# Patient Record
Sex: Female | Born: 1955 | Race: White | Hispanic: No | Marital: Married | Smoking: Never smoker
Health system: Southern US, Community
[De-identification: ages and names within clinical notes are randomized; demographics above are authoritative.]

## PROBLEM LIST (undated history)

## (undated) DIAGNOSIS — T8859XA Other complications of anesthesia, initial encounter: Secondary | ICD-10-CM

## (undated) DIAGNOSIS — R112 Nausea with vomiting, unspecified: Secondary | ICD-10-CM

## (undated) DIAGNOSIS — Z9889 Other specified postprocedural states: Secondary | ICD-10-CM

## (undated) DIAGNOSIS — I10 Essential (primary) hypertension: Secondary | ICD-10-CM

## (undated) DIAGNOSIS — T4145XA Adverse effect of unspecified anesthetic, initial encounter: Secondary | ICD-10-CM

## (undated) DIAGNOSIS — Z8739 Personal history of other diseases of the musculoskeletal system and connective tissue: Secondary | ICD-10-CM

## (undated) DIAGNOSIS — M1990 Unspecified osteoarthritis, unspecified site: Secondary | ICD-10-CM

## (undated) HISTORY — PX: TONSILLECTOMY: SUR1361

## (undated) HISTORY — PX: BLADDER REPAIR: SHX76

## (undated) HISTORY — PX: CHOLECYSTECTOMY: SHX55

---

## 2011-06-10 ENCOUNTER — Other Ambulatory Visit (HOSPITAL_COMMUNITY): Payer: Self-pay | Admitting: Family Medicine

## 2011-06-10 DIAGNOSIS — Z139 Encounter for screening, unspecified: Secondary | ICD-10-CM

## 2011-06-20 ENCOUNTER — Ambulatory Visit (HOSPITAL_COMMUNITY): Payer: Self-pay

## 2011-06-27 ENCOUNTER — Ambulatory Visit (HOSPITAL_COMMUNITY)
Admission: RE | Admit: 2011-06-27 | Discharge: 2011-06-27 | Disposition: A | Discharge status: Home / Self Care | Payer: PRIVATE HEALTH INSURANCE | Source: Ambulatory Visit | Attending: Family Medicine | Admitting: Family Medicine

## 2011-06-27 DIAGNOSIS — Z1231 Encounter for screening mammogram for malignant neoplasm of breast: Secondary | ICD-10-CM | POA: Insufficient documentation

## 2011-06-27 DIAGNOSIS — Z139 Encounter for screening, unspecified: Secondary | ICD-10-CM

## 2012-05-15 ENCOUNTER — Ambulatory Visit: Payer: Self-pay | Admitting: Internal Medicine

## 2012-05-28 ENCOUNTER — Other Ambulatory Visit (HOSPITAL_COMMUNITY): Payer: Self-pay | Admitting: Internal Medicine

## 2012-05-28 DIAGNOSIS — Z139 Encounter for screening, unspecified: Secondary | ICD-10-CM

## 2012-06-29 ENCOUNTER — Ambulatory Visit (HOSPITAL_COMMUNITY)
Admission: RE | Admit: 2012-06-29 | Discharge: 2012-06-29 | Disposition: A | Discharge status: Home / Self Care | Payer: PRIVATE HEALTH INSURANCE | Source: Ambulatory Visit | Attending: Family Medicine | Admitting: Family Medicine

## 2012-06-29 DIAGNOSIS — Z1231 Encounter for screening mammogram for malignant neoplasm of breast: Secondary | ICD-10-CM | POA: Insufficient documentation

## 2012-06-29 DIAGNOSIS — Z139 Encounter for screening, unspecified: Secondary | ICD-10-CM

## 2012-09-17 ENCOUNTER — Other Ambulatory Visit: Payer: Self-pay | Admitting: Gastroenterology

## 2012-09-17 LAB — CLOSTRIDIUM DIFFICILE BY PCR

## 2012-09-19 LAB — STOOL CULTURE

## 2012-09-25 ENCOUNTER — Ambulatory Visit: Payer: Self-pay | Admitting: Gastroenterology

## 2012-09-27 LAB — PATHOLOGY REPORT

## 2013-05-29 ENCOUNTER — Other Ambulatory Visit (HOSPITAL_COMMUNITY): Payer: Self-pay | Admitting: Internal Medicine

## 2013-05-29 DIAGNOSIS — Z139 Encounter for screening, unspecified: Secondary | ICD-10-CM

## 2013-07-01 ENCOUNTER — Ambulatory Visit (HOSPITAL_COMMUNITY)
Admission: RE | Admit: 2013-07-01 | Discharge: 2013-07-01 | Disposition: A | Discharge status: Home / Self Care | Payer: PRIVATE HEALTH INSURANCE | Source: Ambulatory Visit | Attending: Internal Medicine | Admitting: Internal Medicine

## 2013-07-01 DIAGNOSIS — Z139 Encounter for screening, unspecified: Secondary | ICD-10-CM

## 2013-07-01 DIAGNOSIS — Z1231 Encounter for screening mammogram for malignant neoplasm of breast: Secondary | ICD-10-CM | POA: Insufficient documentation

## 2014-06-02 ENCOUNTER — Other Ambulatory Visit (HOSPITAL_COMMUNITY): Payer: Self-pay | Admitting: Internal Medicine

## 2014-06-02 DIAGNOSIS — Z1231 Encounter for screening mammogram for malignant neoplasm of breast: Secondary | ICD-10-CM

## 2014-07-07 ENCOUNTER — Ambulatory Visit (HOSPITAL_COMMUNITY)
Admission: RE | Admit: 2014-07-07 | Discharge: 2014-07-07 | Disposition: A | Discharge status: Home / Self Care | Payer: PRIVATE HEALTH INSURANCE | Source: Ambulatory Visit | Attending: Internal Medicine | Admitting: Internal Medicine

## 2014-07-07 DIAGNOSIS — Z1231 Encounter for screening mammogram for malignant neoplasm of breast: Secondary | ICD-10-CM | POA: Insufficient documentation

## 2014-10-15 ENCOUNTER — Ambulatory Visit: Payer: Self-pay | Admitting: Internal Medicine

## 2015-08-17 ENCOUNTER — Other Ambulatory Visit: Payer: Self-pay | Admitting: Internal Medicine

## 2015-08-17 DIAGNOSIS — Z1231 Encounter for screening mammogram for malignant neoplasm of breast: Secondary | ICD-10-CM

## 2015-08-20 ENCOUNTER — Ambulatory Visit
Admission: RE | Admit: 2015-08-20 | Discharge: 2015-08-20 | Disposition: A | Discharge status: Home / Self Care | Payer: PRIVATE HEALTH INSURANCE | Source: Ambulatory Visit | Attending: Internal Medicine | Admitting: Internal Medicine

## 2015-08-20 DIAGNOSIS — Z1231 Encounter for screening mammogram for malignant neoplasm of breast: Secondary | ICD-10-CM | POA: Diagnosis present

## 2015-11-17 ENCOUNTER — Other Ambulatory Visit: Payer: Self-pay | Admitting: Internal Medicine

## 2015-11-17 DIAGNOSIS — R1012 Left upper quadrant pain: Secondary | ICD-10-CM

## 2015-11-25 ENCOUNTER — Ambulatory Visit: Admission: RE | Admit: 2015-11-25 | Payer: PRIVATE HEALTH INSURANCE | Source: Ambulatory Visit

## 2015-12-04 ENCOUNTER — Ambulatory Visit: Payer: PRIVATE HEALTH INSURANCE

## 2015-12-08 ENCOUNTER — Ambulatory Visit
Admission: RE | Admit: 2015-12-08 | Discharge: 2015-12-08 | Disposition: A | Discharge status: Home / Self Care | Payer: PRIVATE HEALTH INSURANCE | Source: Ambulatory Visit | Attending: Internal Medicine | Admitting: Internal Medicine

## 2015-12-08 DIAGNOSIS — I700 Atherosclerosis of aorta: Secondary | ICD-10-CM | POA: Diagnosis not present

## 2015-12-08 DIAGNOSIS — R1012 Left upper quadrant pain: Secondary | ICD-10-CM | POA: Insufficient documentation

## 2015-12-08 HISTORY — DX: Essential (primary) hypertension: I10

## 2015-12-08 MED ORDER — IOHEXOL 300 MG/ML  SOLN
100.0000 mL | Freq: Once | INTRAMUSCULAR | Status: AC | PRN
Start: 2015-12-08 — End: 2015-12-08
  Administered 2015-12-08: 100 mL via INTRAVENOUS

## 2016-01-27 ENCOUNTER — Other Ambulatory Visit: Payer: Self-pay | Admitting: Student

## 2016-01-27 DIAGNOSIS — M7581 Other shoulder lesions, right shoulder: Secondary | ICD-10-CM

## 2016-02-01 ENCOUNTER — Ambulatory Visit: Payer: No Typology Code available for payment source

## 2016-02-12 ENCOUNTER — Encounter: Payer: Self-pay | Admitting: *Deleted

## 2016-02-12 ENCOUNTER — Other Ambulatory Visit: Payer: PRIVATE HEALTH INSURANCE

## 2016-02-12 DIAGNOSIS — I10 Essential (primary) hypertension: Secondary | ICD-10-CM

## 2016-02-12 NOTE — Patient Instructions (Signed)
  Your procedure is scheduled on: 2/21/17Report to Day Surgery. To find out your arrival time please call [phone] between 1PM - 3PM on 02/15/16.  Remember: Instructions that are not followed completely may result in serious medical risk, up to and including death, or upon the discretion of your surgeon and anesthesiologist your surgery may need to be rescheduled.    __X__ 1. Do not eat food or drink liquids after midnight. No gum chewing or hard candies.     __X__ 2. No Alcohol for 24 hours before or after surgery.   ____ 3. Bring all medications with you on the day of surgery if instructed.    __X__ 4. Notify your doctor if there is any change in your medical condition     (cold, fever, infections).     Do not wear jewelry, make-up, hairpins, clips or nail polish.  Do not wear lotions, powders, or perfumes. You may wear deodorant.  Do not shave 48 hours prior to surgery. Men may shave face and neck.  Do not bring valuables to the hospital.    [facility] is not responsible for any belongings or valuables.               Contacts, dentures or bridgework may not be worn into surgery.  Leave your suitcase in the car. After surgery it may be brought to your room.  For patients admitted to the hospital, discharge time is determined by your                treatment team.   Patients discharged the day of surgery will not be allowed to drive home.   Please read over the following fact sheets that you were given:   Surgical Site Infection Prevention   ____ Take these medicines the morning of surgery with A SIP OF WATER:    1. NONE  2.   3.   4.  5.  6.  ____ Fleet Enema (as directed)   __X__ Use CHG Soap as directed  ____ Use inhalers on the day of surgery  ____ Stop metformin 2 days prior to surgery    ____ Take 1/2 of usual insulin dose the night before surgery and none on the morning of surgery.   _X___ Stop Coumadin/Plavix/aspirin on  STOP ASPIRIN 02/12/16 ____ Stop  Anti-inflammatories on    ____ Stop supplements until after surgery.    ____ Bring C-Pap to the hospital.

## 2016-02-15 ENCOUNTER — Encounter
Admission: RE | Admit: 2016-02-15 | Discharge: 2016-02-15 | Disposition: A | Discharge status: Home / Self Care | Payer: No Typology Code available for payment source | Source: Ambulatory Visit | Attending: Surgery | Admitting: Surgery

## 2016-02-15 DIAGNOSIS — G8929 Other chronic pain: Secondary | ICD-10-CM | POA: Diagnosis not present

## 2016-02-15 DIAGNOSIS — Z820 Family history of epilepsy and other diseases of the nervous system: Secondary | ICD-10-CM | POA: Diagnosis not present

## 2016-02-15 DIAGNOSIS — Z807 Family history of other malignant neoplasms of lymphoid, hematopoietic and related tissues: Secondary | ICD-10-CM | POA: Diagnosis not present

## 2016-02-15 DIAGNOSIS — Z8379 Family history of other diseases of the digestive system: Secondary | ICD-10-CM | POA: Diagnosis not present

## 2016-02-15 DIAGNOSIS — X58XXXA Exposure to other specified factors, initial encounter: Secondary | ICD-10-CM | POA: Diagnosis not present

## 2016-02-15 DIAGNOSIS — Z8042 Family history of malignant neoplasm of prostate: Secondary | ICD-10-CM | POA: Diagnosis not present

## 2016-02-15 DIAGNOSIS — Z79899 Other long term (current) drug therapy: Secondary | ICD-10-CM | POA: Diagnosis not present

## 2016-02-15 DIAGNOSIS — Z9049 Acquired absence of other specified parts of digestive tract: Secondary | ICD-10-CM | POA: Diagnosis not present

## 2016-02-15 DIAGNOSIS — Z800 Family history of malignant neoplasm of digestive organs: Secondary | ICD-10-CM | POA: Diagnosis not present

## 2016-02-15 DIAGNOSIS — I10 Essential (primary) hypertension: Secondary | ICD-10-CM | POA: Diagnosis not present

## 2016-02-15 DIAGNOSIS — Z7982 Long term (current) use of aspirin: Secondary | ICD-10-CM | POA: Diagnosis not present

## 2016-02-15 DIAGNOSIS — S46121A Laceration of muscle, fascia and tendon of long head of biceps, right arm, initial encounter: Secondary | ICD-10-CM | POA: Diagnosis not present

## 2016-02-15 DIAGNOSIS — K219 Gastro-esophageal reflux disease without esophagitis: Secondary | ICD-10-CM | POA: Diagnosis not present

## 2016-02-15 DIAGNOSIS — M75121 Complete rotator cuff tear or rupture of right shoulder, not specified as traumatic: Secondary | ICD-10-CM | POA: Diagnosis not present

## 2016-02-15 DIAGNOSIS — M549 Dorsalgia, unspecified: Secondary | ICD-10-CM | POA: Diagnosis not present

## 2016-02-15 DIAGNOSIS — M7541 Impingement syndrome of right shoulder: Secondary | ICD-10-CM | POA: Diagnosis present

## 2016-02-15 LAB — POTASSIUM: POTASSIUM: 4.3 mmol/L (ref 3.5–5.1)

## 2016-02-16 ENCOUNTER — Ambulatory Visit
Admission: RE | Admit: 2016-02-16 | Discharge: 2016-02-16 | Disposition: A | Discharge status: Home / Self Care | Payer: No Typology Code available for payment source | Source: Ambulatory Visit | Attending: Surgery | Admitting: Surgery

## 2016-02-16 ENCOUNTER — Encounter: Payer: Self-pay | Admitting: *Deleted

## 2016-02-16 ENCOUNTER — Ambulatory Visit: Payer: No Typology Code available for payment source | Admitting: Anesthesiology

## 2016-02-16 ENCOUNTER — Encounter
Admission: RE | Disposition: A | Discharge status: Home / Self Care | Payer: Self-pay | Source: Ambulatory Visit | Attending: Surgery

## 2016-02-16 DIAGNOSIS — M7541 Impingement syndrome of right shoulder: Secondary | ICD-10-CM | POA: Insufficient documentation

## 2016-02-16 DIAGNOSIS — K219 Gastro-esophageal reflux disease without esophagitis: Secondary | ICD-10-CM | POA: Insufficient documentation

## 2016-02-16 DIAGNOSIS — G8929 Other chronic pain: Secondary | ICD-10-CM | POA: Insufficient documentation

## 2016-02-16 DIAGNOSIS — M75121 Complete rotator cuff tear or rupture of right shoulder, not specified as traumatic: Secondary | ICD-10-CM | POA: Insufficient documentation

## 2016-02-16 DIAGNOSIS — S46121A Laceration of muscle, fascia and tendon of long head of biceps, right arm, initial encounter: Secondary | ICD-10-CM | POA: Insufficient documentation

## 2016-02-16 DIAGNOSIS — X58XXXA Exposure to other specified factors, initial encounter: Secondary | ICD-10-CM | POA: Insufficient documentation

## 2016-02-16 DIAGNOSIS — Z820 Family history of epilepsy and other diseases of the nervous system: Secondary | ICD-10-CM | POA: Insufficient documentation

## 2016-02-16 DIAGNOSIS — I10 Essential (primary) hypertension: Secondary | ICD-10-CM | POA: Insufficient documentation

## 2016-02-16 DIAGNOSIS — Z79899 Other long term (current) drug therapy: Secondary | ICD-10-CM | POA: Insufficient documentation

## 2016-02-16 DIAGNOSIS — Z800 Family history of malignant neoplasm of digestive organs: Secondary | ICD-10-CM | POA: Insufficient documentation

## 2016-02-16 DIAGNOSIS — Z807 Family history of other malignant neoplasms of lymphoid, hematopoietic and related tissues: Secondary | ICD-10-CM | POA: Insufficient documentation

## 2016-02-16 DIAGNOSIS — Z7982 Long term (current) use of aspirin: Secondary | ICD-10-CM | POA: Insufficient documentation

## 2016-02-16 DIAGNOSIS — M549 Dorsalgia, unspecified: Secondary | ICD-10-CM | POA: Insufficient documentation

## 2016-02-16 DIAGNOSIS — Z9049 Acquired absence of other specified parts of digestive tract: Secondary | ICD-10-CM | POA: Insufficient documentation

## 2016-02-16 DIAGNOSIS — Z8042 Family history of malignant neoplasm of prostate: Secondary | ICD-10-CM | POA: Insufficient documentation

## 2016-02-16 DIAGNOSIS — Z8379 Family history of other diseases of the digestive system: Secondary | ICD-10-CM | POA: Insufficient documentation

## 2016-02-16 HISTORY — DX: Adverse effect of unspecified anesthetic, initial encounter: T41.45XA

## 2016-02-16 HISTORY — PX: SHOULDER ARTHROSCOPY: SHX128

## 2016-02-16 HISTORY — DX: Other specified postprocedural states: Z98.890

## 2016-02-16 HISTORY — DX: Nausea with vomiting, unspecified: R11.2

## 2016-02-16 HISTORY — DX: Other complications of anesthesia, initial encounter: T88.59XA

## 2016-02-16 SURGERY — ARTHROSCOPY, SHOULDER
Anesthesia: General | Site: Shoulder | Laterality: Right | Wound class: Clean

## 2016-02-16 MED ORDER — SCOPOLAMINE 1 MG/3DAYS TD PT72
1.0000 | MEDICATED_PATCH | Freq: Once | TRANSDERMAL | Status: DC
  Administered 2016-02-16: 1.5 mg via TRANSDERMAL

## 2016-02-16 MED ORDER — BUPIVACAINE-EPINEPHRINE (PF) 0.5% -1:200000 IJ SOLN
INTRAMUSCULAR | Status: AC
  Filled 2016-02-16: qty 30

## 2016-02-16 MED ORDER — ONDANSETRON HCL 4 MG/2ML IJ SOLN
INTRAMUSCULAR | Status: DC | PRN
  Administered 2016-02-16: 4 mg via INTRAVENOUS

## 2016-02-16 MED ORDER — EPINEPHRINE HCL 1 MG/ML IJ SOLN
INTRAMUSCULAR | Status: AC
  Filled 2016-02-16: qty 4

## 2016-02-16 MED ORDER — OXYCODONE HCL 5 MG PO TABS: 5.0000 mg | ORAL_TABLET | ORAL | Status: DC | PRN

## 2016-02-16 MED ORDER — EPINEPHRINE HCL 1 MG/ML IJ SOLN
INTRAMUSCULAR | Status: DC | PRN
  Administered 2016-02-16: 2 mL

## 2016-02-16 MED ORDER — FAMOTIDINE 20 MG PO TABS
20.0000 mg | ORAL_TABLET | Freq: Once | ORAL | Status: AC
  Administered 2016-02-16: 20 mg via ORAL

## 2016-02-16 MED ORDER — SCOPOLAMINE 1 MG/3DAYS TD PT72
MEDICATED_PATCH | TRANSDERMAL | Status: AC
  Administered 2016-02-16: 1.5 mg via TRANSDERMAL
  Filled 2016-02-16: qty 1

## 2016-02-16 MED ORDER — LIDOCAINE HCL (CARDIAC) 20 MG/ML IV SOLN
INTRAVENOUS | Status: DC | PRN
  Administered 2016-02-16: 30 mg via INTRAVENOUS

## 2016-02-16 MED ORDER — CHLORHEXIDINE GLUCONATE 4 % EX LIQD
60.0000 mL | Freq: Once | CUTANEOUS | Status: AC
  Administered 2016-02-16: 4 via TOPICAL

## 2016-02-16 MED ORDER — GLYCOPYRROLATE 0.2 MG/ML IJ SOLN
INTRAMUSCULAR | Status: DC | PRN
  Administered 2016-02-16: 0.2 mg via INTRAVENOUS

## 2016-02-16 MED ORDER — LIDOCAINE HCL (PF) 1 % IJ SOLN
INTRAMUSCULAR | Status: AC
  Filled 2016-02-16: qty 5

## 2016-02-16 MED ORDER — PROPOFOL 10 MG/ML IV BOLUS
INTRAVENOUS | Status: DC | PRN
  Administered 2016-02-16: 200 mg via INTRAVENOUS

## 2016-02-16 MED ORDER — CEFAZOLIN SODIUM-DEXTROSE 2-3 GM-% IV SOLR
INTRAVENOUS | Status: AC
  Administered 2016-02-16: 2 g via INTRAVENOUS
  Filled 2016-02-16: qty 50

## 2016-02-16 MED ORDER — FAMOTIDINE 20 MG PO TABS
ORAL_TABLET | ORAL | Status: AC
  Administered 2016-02-16: 20 mg via ORAL
  Filled 2016-02-16: qty 1

## 2016-02-16 MED ORDER — FENTANYL CITRATE (PF) 100 MCG/2ML IJ SOLN: 25.0000 ug | INTRAMUSCULAR | Status: DC | PRN

## 2016-02-16 MED ORDER — MIDAZOLAM HCL 2 MG/2ML IJ SOLN
INTRAMUSCULAR | Status: DC | PRN
  Administered 2016-02-16: 2 mg via INTRAVENOUS

## 2016-02-16 MED ORDER — CEFAZOLIN SODIUM-DEXTROSE 2-3 GM-% IV SOLR: 2.0000 g | INTRAVENOUS | Status: DC

## 2016-02-16 MED ORDER — DEXAMETHASONE SODIUM PHOSPHATE 10 MG/ML IJ SOLN
INTRAMUSCULAR | Status: DC | PRN
  Administered 2016-02-16: 5 mg via INTRAVENOUS

## 2016-02-16 MED ORDER — ONDANSETRON HCL 4 MG/2ML IJ SOLN: 4.0000 mg | Freq: Once | INTRAMUSCULAR | Status: DC | PRN

## 2016-02-16 MED ORDER — BUPIVACAINE-EPINEPHRINE (PF) 0.5% -1:200000 IJ SOLN
INTRAMUSCULAR | Status: DC | PRN
  Administered 2016-02-16: 30 mL via PERINEURAL

## 2016-02-16 MED ORDER — FENTANYL CITRATE (PF) 100 MCG/2ML IJ SOLN
INTRAMUSCULAR | Status: DC | PRN
  Administered 2016-02-16: 100 ug via INTRAVENOUS

## 2016-02-16 MED ORDER — LACTATED RINGERS IV SOLN
INTRAVENOUS | Status: DC
  Administered 2016-02-16 (×2): via INTRAVENOUS

## 2016-02-16 MED ORDER — EPINEPHRINE HCL 1 MG/ML IJ SOLN
INTRAMUSCULAR | Status: AC
  Filled 2016-02-16: qty 1

## 2016-02-16 MED ORDER — MIDAZOLAM HCL 5 MG/5ML IJ SOLN
INTRAMUSCULAR | Status: AC
  Administered 2016-02-16: 2 mg
  Filled 2016-02-16: qty 5

## 2016-02-16 MED ORDER — ROPIVACAINE HCL 2 MG/ML IJ SOLN
INTRAMUSCULAR | Status: AC
  Filled 2016-02-16: qty 40

## 2016-02-16 SURGICAL SUPPLY — 53 items
ANCHOR JUGGERKNOT WTAP NDL 2.9 (Anchor) ×3 IMPLANT
BIT DRILL JUGRKNT W/NDL BIT2.9 (DRILL) ×1 IMPLANT
BLADE FULL RADIUS 3.5 (BLADE) IMPLANT
BLADE SHAVER 4.5X7 STR FR (MISCELLANEOUS) IMPLANT
BUR ACROMIONIZER 4.0 (BURR) ×3 IMPLANT
BUR BR 5.5 WIDE MOUTH (BURR) IMPLANT
CANNULA 8.5X75 THRED (CANNULA) IMPLANT
CANNULA SHAVER 8MMX76MM (CANNULA) ×3 IMPLANT
CHLORAPREP W/TINT 26ML (MISCELLANEOUS) ×6 IMPLANT
DECANTER SPIKE VIAL GLASS SM (MISCELLANEOUS) ×3 IMPLANT
DRAPE IMP U-DRAPE 54X76 (DRAPES) ×6 IMPLANT
DRAPE SURG 17X11 SM STRL (DRAPES) ×3 IMPLANT
DRILL JUGGERKNOT W/NDL BIT 2.9 (DRILL) ×3
DRSG OPSITE POSTOP 4X8 (GAUZE/BANDAGES/DRESSINGS) ×3 IMPLANT
ELECT REM PT RETURN 9FT ADLT (ELECTROSURGICAL) ×3
ELECTRODE REM PT RTRN 9FT ADLT (ELECTROSURGICAL) ×1 IMPLANT
GAUZE PETRO XEROFOAM 1X8 (MISCELLANEOUS) ×3 IMPLANT
GAUZE SPONGE 4X4 12PLY STRL (GAUZE/BANDAGES/DRESSINGS) ×3 IMPLANT
GLOVE BIO SURGEON STRL SZ7.5 (GLOVE) ×6 IMPLANT
GLOVE BIO SURGEON STRL SZ8 (GLOVE) ×6 IMPLANT
GLOVE BIOGEL PI IND STRL 8 (GLOVE) IMPLANT
GLOVE BIOGEL PI INDICATOR 8 (GLOVE)
GLOVE INDICATOR 8.0 STRL GRN (GLOVE) ×3 IMPLANT
GOWN STRL REUS W/ TWL LRG LVL3 (GOWN DISPOSABLE) ×2 IMPLANT
GOWN STRL REUS W/ TWL XL LVL3 (GOWN DISPOSABLE) ×1 IMPLANT
GOWN STRL REUS W/TWL LRG LVL3 (GOWN DISPOSABLE) ×4
GOWN STRL REUS W/TWL XL LVL3 (GOWN DISPOSABLE) ×2
GRASPER SUT 15 45D LOW PRO (SUTURE) IMPLANT
IV LACTATED RINGER IRRG 3000ML (IV SOLUTION) ×4
IV LR IRRIG 3000ML ARTHROMATIC (IV SOLUTION) ×2 IMPLANT
MANIFOLD NEPTUNE II (INSTRUMENTS) ×3 IMPLANT
MASK FACE SPIDER DISP (MASK) ×3 IMPLANT
MAT BLUE FLOOR 46X72 FLO (MISCELLANEOUS) ×3 IMPLANT
NDL MAYO CATGUT SZ4 (NEEDLE) IMPLANT
NEEDLE HYPO 22GX1.5 SAFETY (NEEDLE) ×3 IMPLANT
NEEDLE MAYO 6 CRC TAPER PT (NEEDLE) ×3 IMPLANT
NEEDLE MAYO CATGUT SZ 1.5 (NEEDLE)
NEEDLE MAYO CATGUT SZ 2 (NEEDLE) IMPLANT
NEEDLE REVERSE CUT 1/2 CRC (NEEDLE) IMPLANT
PACK ARTHROSCOPY SHOULDER (MISCELLANEOUS) ×3 IMPLANT
SLING ARM LRG DEEP (SOFTGOODS) IMPLANT
SLING ULTRA II LG (MISCELLANEOUS) ×3 IMPLANT
STAPLER SKIN PROX 35W (STAPLE) ×3 IMPLANT
STRAP SAFETY BODY (MISCELLANEOUS) ×3 IMPLANT
SUT ETHIBOND 0 MO6 C/R (SUTURE) ×3 IMPLANT
SUT PROLENE 4 0 PS 2 18 (SUTURE) ×3 IMPLANT
SUT VIC AB 2-0 CT1 27 (SUTURE) ×4
SUT VIC AB 2-0 CT1 TAPERPNT 27 (SUTURE) ×2 IMPLANT
TAPE MICROFOAM 4IN (TAPE) ×3 IMPLANT
TUBING ARTHRO INFLOW-ONLY STRL (TUBING) ×3 IMPLANT
TUBING CONNECTING 10 (TUBING) ×2 IMPLANT
TUBING CONNECTING 10' (TUBING) ×1
WAND HAND CNTRL MULTIVAC 90 (MISCELLANEOUS) ×3 IMPLANT

## 2016-02-16 NOTE — Anesthesia Preprocedure Evaluation (Signed)
Anesthesia Evaluation  Patient identified by MRN, date of birth, ID band Patient awake    Reviewed: Allergy & Precautions, H&P , NPO status , Patient's Chart, lab work & pertinent test results, reviewed documented beta blocker date and time   History of Anesthesia Complications (+) PONV and history of anesthetic complications  Airway Mallampati: IV  TM Distance: >3 FB Neck ROM: full    Dental  (+) Teeth Intact   Pulmonary neg pulmonary ROS,    Pulmonary exam normal        Cardiovascular hypertension, negative cardio ROS Normal cardiovascular exam Rhythm:regular Rate:Normal     Neuro/Psych negative neurological ROS  negative psych ROS   GI/Hepatic negative GI ROS, Neg liver ROS,   Endo/Other  negative endocrine ROS  Renal/GU negative Renal ROS  negative genitourinary   Musculoskeletal   Abdominal   Peds  Hematology negative hematology ROS (+)   Anesthesia Other Findings Past Medical History:   Hypertension                                                 Complication of anesthesia                                   PONV (postoperative nausea and vomiting)                   Past Surgical History:   CHOLECYSTECTOMY                                               BLADDER REPAIR                                              BMI    Body Mass Index   25.83 kg/m 2     Reproductive/Obstetrics negative OB ROS                             Anesthesia Physical Anesthesia Plan  ASA: II  Anesthesia Plan: General ETT   Post-op Pain Management: MAC Combined w/ Regional for Post-op pain   Induction:   Airway Management Planned:   Additional Equipment:   Intra-op Plan:   Post-operative Plan:   Informed Consent: I have reviewed the patients History and Physical, chart, labs and discussed the procedure including the risks, benefits and alternatives for the proposed anesthesia with the patient  or authorized representative who has indicated his/her understanding and acceptance.   Dental Advisory Given  Plan Discussed with: CRNA  Anesthesia Plan Comments:         Anesthesia Quick Evaluation

## 2016-02-16 NOTE — Discharge Instructions (Addendum)
Keep dressing dry and intact.  May shower after dressing changed on post-op day #4 (Saturday).  Cover staples with Band-Aids after drying off. Apply ice frequently to shoulder. Take ibuprofen 600 or 800 mg 3 times daily with meals.  Take oxycodone as prescribed as needed for pain.  May supplement with Tylenol as necessary. Keep shoulder immobilizer on at all times except may remove for bathing purposes. Follow-up in 10-14 days or as scheduled.General Anesthesia, Adult General anesthesia is a sleep-like state of non-feeling produced by medicines (anesthetics). General anesthesia prevents you from being alert and feeling pain during a medical procedure. Your caregiver may recommend general anesthesia if your procedure:  Is long.  Is painful or uncomfortable.  Would be frightening to see or hear.  Requires you to be still.  Affects your breathing.  Causes significant blood loss. LET YOUR CAREGIVER KNOW ABOUT:  Allergies to food or medicine.  Medicines taken, including vitamins, herbs, eyedrops, over-the-counter medicines, and creams.  Use of steroids (by mouth or creams).  Previous problems with anesthetics or numbing medicines, including problems experienced by relatives.  History of bleeding problems or blood clots.  Previous surgeries and types of anesthetics received.  Possibility of pregnancy, if this applies.  Use of cigarettes, alcohol, or illegal drugs.  Any health condition(s), especially diabetes, sleep apnea, and high blood pressure. RISKS AND COMPLICATIONS General anesthesia rarely causes complications. However, if complications do occur, they can be life threatening. Complications include:  A lung infection.  A stroke.  A heart attack.  Waking up during the procedure. When this occurs, the patient may be unable to move and communicate that he or she is awake. The patient may feel severe pain. Older adults and adults with serious medical problems are more  likely to have complications than adults who are young and healthy. Some complications can be prevented by answering all of your caregiver's questions thoroughly and by following all pre-procedure instructions. It is important to tell your caregiver if any of the pre-procedure instructions, especially those related to diet, were not followed. Any food or liquid in the stomach can cause problems when you are under general anesthesia. BEFORE THE PROCEDURE  Ask your caregiver if you will have to spend the night at the hospital. If you will not have to spend the night, arrange to have an adult drive you and stay with you for 24 hours.  Follow your caregiver's instructions if you are taking dietary supplements or medicines. Your caregiver may tell you to stop taking them or to reduce your dosage.  Do not smoke for as long as possible before your procedure. If possible, stop smoking 3-6 weeks before the procedure.  Do not take new dietary supplements or medicines within 1 week of your procedure unless your caregiver approves them.  Do not eat within 8 hours of your procedure or as directed by your caregiver. Drink only clear liquids, such as water, black coffee (without milk or cream), and fruit juices (without pulp).  Do not drink within 3 hours of your procedure or as directed by your caregiver.  You may brush your teeth on the morning of the procedure, but make sure to spit out the toothpaste and water when finished. PROCEDURE  You will receive anesthetics through a mask, through an intravenous (IV) access tube, or through both. A doctor who specializes in anesthesia (anesthesiologist) or a nurse who specializes in anesthesia (nurse anesthetist) or both will stay with you throughout the procedure to make sure you remain  unconscious. He or she will also watch your blood pressure, pulse, and oxygen levels to make sure that the anesthetics do not cause any problems. Once you are asleep, a breathing tube  or mask may be used to help you breathe. AFTER THE PROCEDURE You will wake up after the procedure is complete. You may be in the room where the procedure was performed or in a recovery area. You may have a sore throat if a breathing tube was used. You may also feel:  Dizzy.  Weak.  Drowsy.  Confused.  Nauseous.  Cold. These are all normal responses and can be expected to last for up to 24 hours after the procedure is complete. A caregiver will tell you when you are ready to go home. This will usually be when you are fully awake and in stable condition.   This information is not intended to replace advice given to you by your health care provider. Make sure you discuss any questions you have with your health care provider.   Document Released: 03/20/2008 Document Revised: 01/02/2015 Document Reviewed: 04/11/2012 Elsevier Interactive Patient Education [employer]

## 2016-02-16 NOTE — Anesthesia Postprocedure Evaluation (Signed)
Anesthesia Post Note  Patient: Laura Mckay  Procedure(s) Performed: Procedure(s) (LRB): ARTHROSCOPY SHOULDER, rotator cuff repair, extensive debridement, biceps tenodesis, decompression (Right)  Patient location during evaluation: PACU Anesthesia Type: General Level of consciousness: awake and alert Pain management: pain level controlled Vital Signs Assessment: post-procedure vital signs reviewed and stable Respiratory status: spontaneous breathing, nonlabored ventilation, respiratory function stable and patient connected to nasal cannula oxygen Cardiovascular status: blood pressure returned to baseline and stable Postop Assessment: no signs of nausea or vomiting Anesthetic complications: no    Last Vitals:  Filed Vitals:   02/16/16 1250 02/16/16 1318  BP:  143/68  Pulse:  93  Temp: 36.8 C 36.3 C  Resp:  16    Last Pain:  Filed Vitals:   02/16/16 1320  PainSc: 0-No pain                 Yevette Edwards

## 2016-02-16 NOTE — Transfer of Care (Signed)
Immediate Anesthesia Transfer of Care Note  Patient: Laura Mckay  Procedure(s) Performed: Procedure(s): ARTHROSCOPY SHOULDER, rotator cuff repair, extensive debridement, biceps tenodesis, decompression (Right)  Patient Location: PACU  Anesthesia Type:General and Regional  Level of Consciousness: awake, alert  and oriented  Airway & Oxygen Therapy: Patient Spontanous Breathing and Patient connected to nasal cannula oxygen  Post-op Assessment: Report given to RN and Post -op Vital signs reviewed and stable  Post vital signs: Reviewed and stable  Last Vitals:  Filed Vitals:   02/16/16 1020 02/16/16 1035  BP: 126/64 125/67  Pulse: 74 77  Temp:    Resp: 16 18    Complications: No apparent anesthesia complications

## 2016-02-16 NOTE — H&P (Signed)
Paper H&P to be scanned into permanent record. H&P reviewed. No changes. 

## 2016-02-16 NOTE — Op Note (Signed)
02/16/2016  12:14 PM  Patient:   Laura Mckay  Pre-Op Diagnosis:   Impingement/tendinopathy with full-thickness tear of rotator cuff, tear of long head of biceps tendon, and labral tear, right shoulder.  Postoperative diagnosis: Impingement/tendinopathy with full-thickness rotator cuff tear, degenerative labral fraying, and biceps tendinopathy with complete rupture of long head of biceps tendon, right shoulder.  Procedure: Extensive arthroscopic debridement, arthroscopic subacromial decompression, mini-open rotator cuff repair, and mini-open biceps tenodesis, right shoulder.  Anesthesia: General LMA with interscalene block placed preoperatively by the anesthesiologist.  Surgeon:   Maryagnes Amos, MD  Assistant:   Horris Latino, PA-C  Findings: As above. There was a full-thickness tear involving the mid insertional fibers of the supraspinatus tendon measuring approximately 1.5 cm in anterior-posterior dimension and 2.5 cm in medial-lateral dimension the long head of the biceps tendon had ruptured completely, leaving an approximately 1.5 cm stump attached to the glenoid/labrum. There was extensive degenerative fraying of the labrum without actual detachment from the glenoid. The glenoid and humeral articular surfaces both were in satisfactory condition. There also was a longitudinal tear of the subscapularis tendon which was debrided.  Complications: None  Fluids:   800 cc  Estimated blood loss: 5 cc  Tourniquet time: None  Drains: None  Closure: Staples   Brief clinical note: The patient is a 60 year old female with a 1+ year history of right shoulder pain. The patient's symptoms have progressed despite medications, activity modification, etc. The patient's history and examination are consistent with impingement/tendinopathy with a rotator cuff tear. These findings were confirmed by MRI scan. The patient presents at this time for definitive management of these  shoulder symptoms.  Procedure: The patient underwent placement of an interscalene block by the anesthesiologist in the preoperative holding area before she was brought into the operating room and lain in the supine position. The patient then underwent general laryngeal mask anesthesia before being repositioned in the beach chair position using the beach chair positioner. The right shoulder and upper extremity were prepped with ChloraPrep solution before being draped sterilely. Preoperative antibiotics were administered. A timeout was performed to confirm the proper surgical site before the expected portal sites and incision site were injected with 0.5% Sensorcaine with epinephrine. A posterior portal was created and the glenohumeral joint thoroughly inspected with the findings as described above. An anterior portal was created using an outside-in technique. The labrum and rotator cuff were further probed, again confirming the above-noted findings. The areas of labral fraying anteriorly, superiorly, and posteriorly were debrided back to stable margins using the full-radius resector. In addition, the areas of rotator cuff tearing involving both the supraspinatus and the subscapularis tendons were debrided back to stable margins using the full-radius resector. Finally, biceps stump was debrided back to the labrum using the full-radius resector before the ArthroCare wand was inserted and used to contour the remaining stump to the labrum. The ArthroCare wand was then used to obtain hemostasis as well as to "anneal" the labrum superiorly and anteriorly. The instruments were removed from the joint after suctioning the excess fluid.  The camera was repositioned through the posterior portal into the subacromial space. A separate lateral portal was created using an outside-in technique. The 3.5 mm full-radius resector was introduced and used to perform a subtotal bursectomy. The ArthroCare wand was then inserted and  used to remove the periosteal tissue off the undersurface of the anterior third of the acromion as well as to recess the coracoacromial ligament from its attachment along the anterior  and lateral margins of the acromion. The 4.0 mm acromionizing bur was introduced and used to complete the decompression by removing the undersurface of the anterior third of the acromion. The full radius resector was reintroduced to remove any residual bony debris before the ArthroCare wand was reintroduced to obtain hemostasis. The instruments were then removed from the subacromial space after suctioning the excess fluid.  An approximately 4-5 cm incision was made over the anterolateral aspect of the shoulder beginning at the anterolateral corner of the acromion and extending distally in line with the bicipital groove. This incision was carried down through the subcutaneous tissues to expose the deltoid fascia. The raphae between the anterior and middle thirds was identified and this plane developed to provide access into the subacromial space. Additional bursal tissues were debrided sharply using Metzenbaum scissors. The rotator cuff tear was readily identified. The margins were debrided sharply with a #15 blade and the exposed greater tuberosity roughened with a rongeur. The tear was repaired using a single Biomet 2.9 mm JuggerKnot anchor. One set of sutures was placed through the posterior flap of the second set of sutures were placed in the more anterior flap. Several #0 Ethibond interrupted sutures were placed in a side-to-side fashion to complete the repair of the rotator cuff tear. An apparent watertight closure was obtained.  The bicipital groove was identified by palpation and opened for 1-1.5 cm. The biceps tendon stump was identified through this defect. However, the tendon could not be retrieved through this defect as it appeared to have scarred into the adjacent soft tissues. Therefore, a single #0 Ethibond  interrupted suture was placed to perform a soft tissue tenodesis.  The wound was copiously irrigated with sterile saline solution before the deltoid raphae was reapproximated using 2-0 Vicryl interrupted sutures. The subcutaneous tissues were closed in two layers using 2-0 Vicryl interrupted sutures before the skin was closed using staples. The portal sites also were closed using staples. A sterile bulky dressing was applied to the shoulder before the arm was placed into a shoulder immobilizer. The patient was then awakened, extubated, and returned to the recovery room in satisfactory condition after tolerating the procedure well.

## 2016-02-17 ENCOUNTER — Ambulatory Visit: Payer: PRIVATE HEALTH INSURANCE

## 2016-11-11 ENCOUNTER — Other Ambulatory Visit: Payer: Self-pay | Admitting: Internal Medicine

## 2016-11-11 DIAGNOSIS — Z1231 Encounter for screening mammogram for malignant neoplasm of breast: Secondary | ICD-10-CM

## 2016-11-14 ENCOUNTER — Ambulatory Visit
Admission: RE | Admit: 2016-11-14 | Discharge: 2016-11-14 | Disposition: A | Discharge status: Home / Self Care | Payer: No Typology Code available for payment source | Source: Ambulatory Visit | Attending: Internal Medicine | Admitting: Internal Medicine

## 2016-11-14 ENCOUNTER — Encounter (INDEPENDENT_AMBULATORY_CARE_PROVIDER_SITE_OTHER): Payer: Self-pay

## 2016-11-14 DIAGNOSIS — Z1231 Encounter for screening mammogram for malignant neoplasm of breast: Secondary | ICD-10-CM | POA: Diagnosis present

## 2016-11-29 ENCOUNTER — Other Ambulatory Visit: Payer: Self-pay | Admitting: Internal Medicine

## 2016-11-29 DIAGNOSIS — R1032 Left lower quadrant pain: Principal | ICD-10-CM

## 2016-11-29 DIAGNOSIS — R1031 Right lower quadrant pain: Secondary | ICD-10-CM

## 2017-01-30 ENCOUNTER — Ambulatory Visit: Payer: No Typology Code available for payment source

## 2018-04-25 ENCOUNTER — Other Ambulatory Visit: Payer: Self-pay | Admitting: Internal Medicine

## 2018-04-25 DIAGNOSIS — Z1231 Encounter for screening mammogram for malignant neoplasm of breast: Secondary | ICD-10-CM

## 2018-04-26 ENCOUNTER — Ambulatory Visit
Admission: RE | Admit: 2018-04-26 | Discharge: 2018-04-26 | Disposition: A | Discharge status: Home / Self Care | Payer: No Typology Code available for payment source | Source: Ambulatory Visit | Attending: Internal Medicine | Admitting: Internal Medicine

## 2018-04-26 DIAGNOSIS — Z1231 Encounter for screening mammogram for malignant neoplasm of breast: Secondary | ICD-10-CM

## 2018-07-18 ENCOUNTER — Other Ambulatory Visit: Payer: Self-pay | Admitting: Internal Medicine

## 2018-07-18 DIAGNOSIS — M5441 Lumbago with sciatica, right side: Principal | ICD-10-CM

## 2018-07-18 DIAGNOSIS — G8929 Other chronic pain: Secondary | ICD-10-CM

## 2018-07-18 DIAGNOSIS — M5442 Lumbago with sciatica, left side: Principal | ICD-10-CM

## 2019-03-19 ENCOUNTER — Encounter: Payer: Self-pay | Admitting: Obstetrics and Gynecology

## 2019-04-24 ENCOUNTER — Encounter: Payer: Self-pay | Admitting: Obstetrics and Gynecology

## 2019-05-27 ENCOUNTER — Encounter: Payer: Self-pay | Admitting: Obstetrics and Gynecology

## 2019-05-29 ENCOUNTER — Other Ambulatory Visit: Payer: Self-pay

## 2019-05-29 ENCOUNTER — Encounter: Payer: Self-pay | Admitting: Obstetrics and Gynecology

## 2019-05-29 ENCOUNTER — Ambulatory Visit: Payer: No Typology Code available for payment source | Admitting: Obstetrics and Gynecology

## 2019-05-29 VITALS — BP 182/98 | HR 70 | Ht 65.0 in | Wt 160.1 lb

## 2019-05-29 DIAGNOSIS — N952 Postmenopausal atrophic vaginitis: Secondary | ICD-10-CM

## 2019-05-29 DIAGNOSIS — R8290 Unspecified abnormal findings in urine: Secondary | ICD-10-CM

## 2019-05-29 DIAGNOSIS — N393 Stress incontinence (female) (male): Secondary | ICD-10-CM

## 2019-05-29 LAB — POCT URINALYSIS DIPSTICK OB
Bilirubin, UA: NEGATIVE
Blood, UA: NEGATIVE
Glucose, UA: NEGATIVE
Ketones, UA: NEGATIVE
Leukocytes, UA: NEGATIVE
Nitrite, UA: POSITIVE
POC,PROTEIN,UA: NEGATIVE
Spec Grav, UA: 1.01 (ref 1.010–1.025)
Urobilinogen, UA: 0.2 E.U./dL
pH, UA: 5 (ref 5.0–8.0)

## 2019-05-29 MED ORDER — NITROFURANTOIN MONOHYD MACRO 100 MG PO CAPS: 100.0000 mg | ORAL_CAPSULE | Freq: Two times a day (BID) | ORAL | 1 refills | Status: DC

## 2019-05-29 MED ORDER — ESTROGENS, CONJUGATED 0.625 MG/GM VA CREA: TOPICAL_CREAM | VAGINAL | 1 refills | Status: DC

## 2019-05-29 NOTE — Progress Notes (Signed)
HPI:      Laura Mckay is a 63 y.o. No obstetric history on file. who LMP was No LMP recorded. Patient is postmenopausal.  Subjective:   She presents today with complaint of worsening urine loss.  She had a previous "sling procedure" 14 years ago which she describes as successful.  Approximately 8 months ago she began to leak more often and over the last month it has become significantly worse.  She has no idea what type of sling procedure she had with her was mesh porcine or something else.  She did not have a hysterectomy with the procedure.  Significant note patient has recently begun to experience significant back and left hip pain.  She is on chronic narcotics has received several steroid injections and has a work-up including an MRI scheduled in the next week.  She finds it very difficult to move and to perform daily functions.    Hx: The following portions of the patient's history were reviewed and updated as appropriate:             She  has a past medical history of Complication of anesthesia, Hypertension, and PONV (postoperative nausea and vomiting). She does not have a problem list on file. She  has a past surgical history that includes Cholecystectomy; Bladder repair; and Shoulder arthroscopy (Right, 02/16/2016). Her family history includes Breast cancer in her paternal aunt; Breast cancer (age of onset: 1) in her maternal aunt. She  reports that she has never smoked. She has never used smokeless tobacco. She reports that she does not drink alcohol or use drugs. She has a current medication list which includes the following prescription(s): bisoprolol-hydrochlorothiazide, desloratadine, gabapentin, hydrocodone-acetaminophen, probiotic product, conjugated estrogens, and nitrofurantoin (macrocrystal-monohydrate). She has No Known Allergies.       Review of Systems:  Review of Systems  Constitutional: Denied constitutional symptoms, night sweats, recent illness, fatigue, fever,  insomnia and weight loss.  Eyes: Denied eye symptoms, eye pain, photophobia, vision change and visual disturbance.  Ears/Nose/Throat/Neck: Denied ear, nose, throat or neck symptoms, hearing loss, nasal discharge, sinus congestion and sore throat.  Cardiovascular: Denied cardiovascular symptoms, arrhythmia, chest pain/pressure, edema, exercise intolerance, orthopnea and palpitations.  Respiratory: Denied pulmonary symptoms, asthma, pleuritic pain, productive sputum, cough, dyspnea and wheezing.  Gastrointestinal: Denied, gastro-esophageal reflux, melena, nausea and vomiting.  Genitourinary: See HPI for additional information.  Musculoskeletal: Denied musculoskeletal symptoms, stiffness, swelling, muscle weakness and myalgia.  Dermatologic: Denied dermatology symptoms, rash and scar.  Neurologic: Denied neurology symptoms, dizziness, headache, neck pain and syncope.  Psychiatric: Denied psychiatric symptoms, anxiety and depression.  Endocrine: Denied endocrine symptoms including hot flashes and night sweats.   Meds:   Current Outpatient Medications on File Prior to Visit  Medication Sig Dispense Refill  . bisoprolol-hydrochlorothiazide (ZIAC) 5-6.25 MG tablet Take 1 tablet by mouth daily.    Marland Kitchen desloratadine (CLARINEX) 5 MG tablet Take 5 mg by mouth daily.    Marland Kitchen gabapentin (NEURONTIN) 300 MG capsule 1 po qAM, 1 qLunch, and 2 aHS    . HYDROcodone-acetaminophen (NORCO/VICODIN) 5-325 MG tablet 1 po bid prn    . Probiotic Product (PROBIOTIC ADVANCED PO) Take 1 tablet by mouth daily.     No current facility-administered medications on file prior to visit.     Objective:     Vitals:   05/29/19 1055  BP: (!) 182/98  Pulse: 70              Physical examination   Pelvic:  Vulva: Normal appearance.  No lesions.  Vagina: No lesions or abnormalities noted.  I believe I can palpate the sling anteriorly and it feels as if it is likely permanent mesh. Vaginal atrophy noted  Support:   Second-degree cystocele second-degree rectocele  Urethra No masses tenderness or scarring.  Meatus Normal size without lesions or prolapse.  Cervix: Normal appearance.  No lesions.  Anus: Normal exam.  No lesions.  Perineum: Normal exam.  No lesions.        Bimanual   Uterus: Normal size.  Non-tender.  Mobile.  AV.  Adnexae: No masses.  Non-tender to palpation.  Cul-de-sac: Negative for abnormality.     Assessment:    No obstetric history on file. There are no active problems to display for this patient.    1. Abnormal urine odor   2. Stress incontinence of urine   3. Vaginal atrophy     It is possible that her urine loss has become significantly worse because of a UTI. (Nitrites are noted in urine)   Plan:            1.  We will begin Macrobid for 7 days to treat UTI  2.  Premarin vaginal cream 3 times per week for atrophic vaginitis and possibly strengthening the base of the bladder  3.  Patient to complete work-up for hip and back issues prior to any further evaluation of urine loss  4.  Consider urologic referral because of previous procedures if necessary.  5.  Patient to obtain old records [place]. Orders Orders Placed This Encounter  Procedures  . POC Urinalysis Dipstick OB     Meds ordered this encounter  Medications  . conjugated estrogens (PREMARIN) vaginal cream    Sig: 1 gram intravaginal TIW    Dispense:  60 g    Refill:  1  . nitrofurantoin, macrocrystal-monohydrate, (MACROBID) 100 MG capsule    Sig: Take 1 capsule (100 mg total) by mouth 2 (two) times daily.    Dispense:  14 capsule    Refill:  1      F/U  No follow-ups on file. I spent 32 minutes involved in the care of this patient of which greater than 50% was spent discussing urine loss, previous surgery, work-up for urine loss, function of sling procedure, back and hip issues, steroid injections, all questions answered.  Elonda Huskyavid J. Cyanne Delmar,  M.D. 05/29/2019 11:34 AM

## 2019-05-29 NOTE — Progress Notes (Signed)
Patient comes in today as a new patient. She has had a sling since 2006. She has started leaking bad and has an odor.

## 2019-06-04 ENCOUNTER — Other Ambulatory Visit: Payer: Self-pay | Admitting: Internal Medicine

## 2019-06-04 DIAGNOSIS — Z1231 Encounter for screening mammogram for malignant neoplasm of breast: Secondary | ICD-10-CM

## 2019-06-07 ENCOUNTER — Ambulatory Visit
Admission: RE | Admit: 2019-06-07 | Discharge: 2019-06-07 | Disposition: A | Discharge status: Home / Self Care | Payer: No Typology Code available for payment source | Source: Ambulatory Visit | Attending: Internal Medicine | Admitting: Internal Medicine

## 2019-06-07 ENCOUNTER — Other Ambulatory Visit: Payer: Self-pay

## 2019-06-07 DIAGNOSIS — Z1231 Encounter for screening mammogram for malignant neoplasm of breast: Secondary | ICD-10-CM | POA: Diagnosis not present

## 2019-06-18 ENCOUNTER — Other Ambulatory Visit: Payer: Self-pay

## 2019-06-18 ENCOUNTER — Encounter
Admission: RE | Admit: 2019-06-18 | Discharge: 2019-06-18 | Disposition: A | Discharge status: Home / Self Care | Payer: No Typology Code available for payment source | Source: Ambulatory Visit | Attending: Orthopedic Surgery | Admitting: Orthopedic Surgery

## 2019-06-18 DIAGNOSIS — R001 Bradycardia, unspecified: Secondary | ICD-10-CM | POA: Insufficient documentation

## 2019-06-18 DIAGNOSIS — I4510 Unspecified right bundle-branch block: Secondary | ICD-10-CM | POA: Insufficient documentation

## 2019-06-18 DIAGNOSIS — Z01818 Encounter for other preprocedural examination: Secondary | ICD-10-CM | POA: Diagnosis present

## 2019-06-18 DIAGNOSIS — I10 Essential (primary) hypertension: Secondary | ICD-10-CM | POA: Insufficient documentation

## 2019-06-18 DIAGNOSIS — Z1159 Encounter for screening for other viral diseases: Secondary | ICD-10-CM | POA: Insufficient documentation

## 2019-06-18 HISTORY — DX: Personal history of other diseases of the musculoskeletal system and connective tissue: Z87.39

## 2019-06-18 HISTORY — DX: Unspecified osteoarthritis, unspecified site: M19.90

## 2019-06-18 LAB — CBC
HCT: 40.3 % (ref 36.0–46.0)
Hemoglobin: 13.3 g/dL (ref 12.0–15.0)
MCH: 31.2 pg (ref 26.0–34.0)
MCHC: 33 g/dL (ref 30.0–36.0)
MCV: 94.6 fL (ref 80.0–100.0)
Platelets: 339 10*3/uL (ref 150–400)
RBC: 4.26 MIL/uL (ref 3.87–5.11)
RDW: 12.8 % (ref 11.5–15.5)
WBC: 6.6 10*3/uL (ref 4.0–10.5)
nRBC: 0 % (ref 0.0–0.2)

## 2019-06-18 LAB — URINALYSIS, ROUTINE W REFLEX MICROSCOPIC
Bacteria, UA: NONE SEEN
Bilirubin Urine: NEGATIVE
Glucose, UA: NEGATIVE mg/dL
Hgb urine dipstick: NEGATIVE
Ketones, ur: NEGATIVE mg/dL
Nitrite: NEGATIVE
Protein, ur: NEGATIVE mg/dL
Specific Gravity, Urine: 1.023 (ref 1.005–1.030)
pH: 5 (ref 5.0–8.0)

## 2019-06-18 LAB — BASIC METABOLIC PANEL
Anion gap: 6 (ref 5–15)
BUN: 26 mg/dL — ABNORMAL HIGH (ref 8–23)
CO2: 28 mmol/L (ref 22–32)
Calcium: 9.4 mg/dL (ref 8.9–10.3)
Chloride: 103 mmol/L (ref 98–111)
Creatinine, Ser: 1.04 mg/dL — ABNORMAL HIGH (ref 0.44–1.00)
GFR calc Af Amer: >60 mL/min (ref >60)
GFR calc non Af Amer: 58 mL/min — ABNORMAL LOW (ref >60)
Glucose, Bld: 90 mg/dL (ref 70–99)
Potassium: 3.7 mmol/L (ref 3.5–5.1)
Sodium: 137 mmol/L (ref 135–145)

## 2019-06-18 LAB — TYPE AND SCREEN
ABO/RH(D): O POS
Antibody Screen: NEGATIVE

## 2019-06-18 LAB — SURGICAL PCR SCREEN
MRSA, PCR: NEGATIVE
Staphylococcus aureus: POSITIVE — AB

## 2019-06-18 LAB — SEDIMENTATION RATE: Sed Rate: 8 mm/hr (ref 0–30)

## 2019-06-18 LAB — PROTIME-INR
INR: 1 (ref 0.8–1.2)
Prothrombin Time: 12.7 seconds (ref 11.4–15.2)

## 2019-06-18 LAB — APTT: aPTT: 26 seconds (ref 24–36)

## 2019-06-18 NOTE — Patient Instructions (Signed)
Your procedure is scheduled on: June 25, 2019 TUESDAY Report to Day Surgery on the 2nd floor of the [facility]. To find out your arrival time, please call [phone] between 1PM - 3PM on: June 24, 2019 Monday   REMEMBER: Instructions that are not followed completely may result in serious medical risk, up to and including death; or upon the discretion of your surgeon and anesthesiologist your surgery may need to be rescheduled.  Do not eat food after midnight the night before surgery.  No gum chewing, lozengers or hard candies.  You may however, drink CLEAR liquids up to 2 hours before you are scheduled to arrive for your surgery. Do not drink anything within 2 hours of the start of your surgery.  Clear liquids include: - water  - apple juice without pulp -CLEAR  gatorade - black coffee or tea (Do NOT add milk or creamers to the coffee or tea) Do NOT drink anything that is not on this list.  Type 1 and Type 2 diabetics should only drink water.  No Alcohol for 24 hours before or after surgery.  No Smoking including e-cigarettes for 24 hours prior to surgery.  No chewable tobacco products for at least 6 hours prior to surgery.  No nicotine patches on the day of surgery.  On the morning of surgery brush your teeth with toothpaste and water, you may rinse your mouth with mouthwash if you wish. Do not swallow any toothpaste or mouthwash.  Notify your doctor if there is any change in your medical condition (cold, fever, infection).  Do not wear jewelry, make-up, hairpins, clips or nail polish.  Do not wear lotions, powders, or perfumes OR DEODORANT   Do not shave 48 hours prior to surgery.   Contacts and dentures may not be worn into surgery.  Do not bring valuables to the hospital, including drivers license, insurance or credit cards.  [facility] is not responsible for any belongings or valuables.   TAKE THESE MEDICATIONS THE MORNING OF SURGERY: PAIN PILL IF  NEEDED    Use CHG Soap  as directed on instruction sheet.  Stop Anti-inflammatories (NSAIDS) such as Advil, Aleve, Ibuprofen, Motrin, Naproxen, Naprosyn and ASPIRIN OR  Aspirin based products such as Excedrin, Goodys Powder, BC Powder. (May take Tylenol or Acetaminophen if needed.)  Stop ANY OVER THE COUNTER supplements until after surgery. (May continue Vitamin D, Vitamin B, and multivitamin.)  Wear comfortable clothing (specific to your surgery type) to the hospital.  Plan for stool softeners for home use.  If you are being admitted to the hospital overnight, leave your suitcase in the car. After surgery it may be brought to your room.  If you are being discharged the day of surgery, you will not be allowed to drive home. You will need a responsible adult to drive you home and stay with you that night.   If you are taking public transportation, you will need to have a responsible adult with you. Please confirm with your physician that it is acceptable to use public transportation.   Please call [phone] if you have any questions about these instructions.

## 2019-06-18 NOTE — Pre-Procedure Instructions (Signed)
Abnormal PCR screen- positive for staph. Result send to Dr Rudene Christians office.

## 2019-06-19 LAB — URINE CULTURE: Culture: <10000 — AB

## 2019-06-20 ENCOUNTER — Telehealth: Payer: Self-pay | Admitting: Obstetrics and Gynecology

## 2019-06-20 NOTE — Telephone Encounter (Signed)
MRR faxed back/ Office not able to send [facility] records due to pt never being seen. Note placed in DJE box. Thanks

## 2019-06-21 ENCOUNTER — Other Ambulatory Visit: Payer: Self-pay

## 2019-06-21 ENCOUNTER — Other Ambulatory Visit
Admission: RE | Admit: 2019-06-21 | Discharge: 2019-06-21 | Disposition: A | Discharge status: Home / Self Care | Payer: No Typology Code available for payment source | Source: Ambulatory Visit | Attending: Orthopedic Surgery | Admitting: Orthopedic Surgery

## 2019-06-21 DIAGNOSIS — Z01818 Encounter for other preprocedural examination: Secondary | ICD-10-CM | POA: Diagnosis not present

## 2019-06-22 LAB — NOVEL CORONAVIRUS, NAA (HOSP ORDER, SEND-OUT TO REF LAB; TAT 18-24 HRS): SARS-CoV-2, NAA: NOT DETECTED

## 2019-06-24 MED ORDER — CEFAZOLIN SODIUM-DEXTROSE 2-4 GM/100ML-% IV SOLN
2.0000 g | Freq: Once | INTRAVENOUS | Status: AC
  Administered 2019-06-25: 2 g via INTRAVENOUS

## 2019-06-25 ENCOUNTER — Inpatient Hospital Stay: Payer: No Typology Code available for payment source

## 2019-06-25 ENCOUNTER — Inpatient Hospital Stay: Payer: No Typology Code available for payment source | Admitting: Certified Registered Nurse Anesthetist

## 2019-06-25 ENCOUNTER — Encounter: Payer: Self-pay | Admitting: *Deleted

## 2019-06-25 ENCOUNTER — Other Ambulatory Visit: Payer: Self-pay

## 2019-06-25 ENCOUNTER — Inpatient Hospital Stay
Admission: RE | Admit: 2019-06-25 | Discharge: 2019-06-28 | DRG: 470 | Disposition: A | Discharge status: Home / Self Care | Payer: No Typology Code available for payment source | Attending: Orthopedic Surgery | Admitting: Orthopedic Surgery

## 2019-06-25 ENCOUNTER — Encounter
Admission: RE | Disposition: A | Discharge status: Home / Self Care | Payer: Self-pay | Source: Home / Self Care | Attending: Orthopedic Surgery

## 2019-06-25 DIAGNOSIS — G8929 Other chronic pain: Secondary | ICD-10-CM | POA: Diagnosis present

## 2019-06-25 DIAGNOSIS — Z419 Encounter for procedure for purposes other than remedying health state, unspecified: Secondary | ICD-10-CM

## 2019-06-25 DIAGNOSIS — M549 Dorsalgia, unspecified: Secondary | ICD-10-CM | POA: Diagnosis present

## 2019-06-25 DIAGNOSIS — K219 Gastro-esophageal reflux disease without esophagitis: Secondary | ICD-10-CM | POA: Diagnosis present

## 2019-06-25 DIAGNOSIS — Z791 Long term (current) use of non-steroidal anti-inflammatories (NSAID): Secondary | ICD-10-CM | POA: Diagnosis not present

## 2019-06-25 DIAGNOSIS — G8918 Other acute postprocedural pain: Secondary | ICD-10-CM

## 2019-06-25 DIAGNOSIS — Z96642 Presence of left artificial hip joint: Secondary | ICD-10-CM

## 2019-06-25 DIAGNOSIS — M1612 Unilateral primary osteoarthritis, left hip: Principal | ICD-10-CM | POA: Diagnosis present

## 2019-06-25 DIAGNOSIS — Z79899 Other long term (current) drug therapy: Secondary | ICD-10-CM | POA: Diagnosis not present

## 2019-06-25 DIAGNOSIS — I10 Essential (primary) hypertension: Secondary | ICD-10-CM | POA: Diagnosis present

## 2019-06-25 DIAGNOSIS — Z9049 Acquired absence of other specified parts of digestive tract: Secondary | ICD-10-CM

## 2019-06-25 HISTORY — PX: TOTAL HIP ARTHROPLASTY: SHX124

## 2019-06-25 LAB — CBC
HCT: 37.5 % (ref 36.0–46.0)
Hemoglobin: 12.3 g/dL (ref 12.0–15.0)
MCH: 31.6 pg (ref 26.0–34.0)
MCHC: 32.8 g/dL (ref 30.0–36.0)
MCV: 96.4 fL (ref 80.0–100.0)
Platelets: 273 K/uL (ref 150–400)
RBC: 3.89 MIL/uL (ref 3.87–5.11)
RDW: 12.8 % (ref 11.5–15.5)
WBC: 13.9 K/uL — ABNORMAL HIGH (ref 4.0–10.5)
nRBC: 0 % (ref 0.0–0.2)

## 2019-06-25 LAB — CREATININE, SERUM
Creatinine, Ser: 0.76 mg/dL (ref 0.44–1.00)
GFR calc Af Amer: >60 mL/min
GFR calc non Af Amer: >60 mL/min

## 2019-06-25 LAB — ABO/RH: ABO/RH(D): O POS

## 2019-06-25 SURGERY — ARTHROPLASTY, HIP, TOTAL,POSTERIOR APPROACH
Anesthesia: Spinal | Site: Hip | Laterality: Left

## 2019-06-25 MED ORDER — ONDANSETRON HCL 4 MG/2ML IJ SOLN: 4.0000 mg | Freq: Once | INTRAMUSCULAR | Status: DC | PRN

## 2019-06-25 MED ORDER — PROPOFOL 500 MG/50ML IV EMUL
INTRAVENOUS | Status: AC
  Filled 2019-06-25: qty 50

## 2019-06-25 MED ORDER — ACETAMINOPHEN 10 MG/ML IV SOLN
INTRAVENOUS | Status: DC | PRN
  Administered 2019-06-25: 1000 mg via INTRAVENOUS

## 2019-06-25 MED ORDER — ACETAMINOPHEN 325 MG PO TABS
325.0000 mg | ORAL_TABLET | Freq: Four times a day (QID) | ORAL | Status: DC | PRN
  Administered 2019-06-27 (×2): 650 mg via ORAL
  Filled 2019-06-25 (×2): qty 2

## 2019-06-25 MED ORDER — FAMOTIDINE 20 MG PO TABS
ORAL_TABLET | ORAL | Status: AC
  Administered 2019-06-25: 11:00:00 20 mg via ORAL
  Filled 2019-06-25: qty 1

## 2019-06-25 MED ORDER — BISACODYL 5 MG PO TBEC: 5.0000 mg | DELAYED_RELEASE_TABLET | Freq: Every day | ORAL | Status: DC | PRN

## 2019-06-25 MED ORDER — OXYCODONE HCL 5 MG PO TABS
10.0000 mg | ORAL_TABLET | ORAL | Status: DC | PRN
  Administered 2019-06-25 – 2019-06-26 (×3): 10 mg via ORAL
  Filled 2019-06-25: qty 2

## 2019-06-25 MED ORDER — SODIUM CHLORIDE (PF) 0.9 % IJ SOLN
INTRAMUSCULAR | Status: AC
  Filled 2019-06-25: qty 50

## 2019-06-25 MED ORDER — METOCLOPRAMIDE HCL 10 MG PO TABS: 5.0000 mg | ORAL_TABLET | Freq: Three times a day (TID) | ORAL | Status: DC | PRN

## 2019-06-25 MED ORDER — NEOMYCIN-POLYMYXIN B GU 40-200000 IR SOLN
Status: AC
  Filled 2019-06-25: qty 4

## 2019-06-25 MED ORDER — MEPERIDINE HCL 50 MG/ML IJ SOLN
INTRAMUSCULAR | Status: AC
  Filled 2019-06-25: qty 1

## 2019-06-25 MED ORDER — SODIUM CHLORIDE 0.9 % IV SOLN
INTRAVENOUS | Status: DC | PRN
  Administered 2019-06-25: 50 ug/min via INTRAVENOUS

## 2019-06-25 MED ORDER — ONDANSETRON HCL 4 MG/2ML IJ SOLN
INTRAMUSCULAR | Status: AC
  Filled 2019-06-25: qty 2

## 2019-06-25 MED ORDER — NEOMYCIN-POLYMYXIN B GU 40-200000 IR SOLN
Status: DC | PRN
  Administered 2019-06-25: 4 mL

## 2019-06-25 MED ORDER — GABAPENTIN 300 MG PO CAPS
300.0000 mg | ORAL_CAPSULE | Freq: Every day | ORAL | Status: DC
  Administered 2019-06-25 – 2019-06-27 (×3): 300 mg via ORAL
  Filled 2019-06-25 (×3): qty 1

## 2019-06-25 MED ORDER — MENTHOL 3 MG MT LOZG
1.0000 | LOZENGE | OROMUCOSAL | Status: DC | PRN
  Filled 2019-06-25: qty 9

## 2019-06-25 MED ORDER — METHOCARBAMOL 1000 MG/10ML IJ SOLN
500.0000 mg | Freq: Four times a day (QID) | INTRAVENOUS | Status: DC | PRN
  Filled 2019-06-25: qty 5

## 2019-06-25 MED ORDER — ACETAMINOPHEN 10 MG/ML IV SOLN
INTRAVENOUS | Status: AC
  Filled 2019-06-25: qty 100

## 2019-06-25 MED ORDER — MIDAZOLAM HCL 5 MG/5ML IJ SOLN
INTRAMUSCULAR | Status: DC | PRN
  Administered 2019-06-25: 2 mg via INTRAVENOUS

## 2019-06-25 MED ORDER — DOCUSATE SODIUM 100 MG PO CAPS
100.0000 mg | ORAL_CAPSULE | Freq: Two times a day (BID) | ORAL | Status: DC
  Administered 2019-06-25 – 2019-06-28 (×6): 100 mg via ORAL
  Filled 2019-06-25 (×6): qty 1

## 2019-06-25 MED ORDER — ONDANSETRON HCL 4 MG/2ML IJ SOLN
INTRAMUSCULAR | Status: DC | PRN
  Administered 2019-06-25: 4 mg via INTRAVENOUS

## 2019-06-25 MED ORDER — TRANEXAMIC ACID 1000 MG/10ML IV SOLN
INTRAVENOUS | Status: AC
  Filled 2019-06-25: qty 10

## 2019-06-25 MED ORDER — ZOLPIDEM TARTRATE 5 MG PO TABS: 5.0000 mg | ORAL_TABLET | Freq: Every evening | ORAL | Status: DC | PRN

## 2019-06-25 MED ORDER — MAGNESIUM HYDROXIDE 400 MG/5ML PO SUSP
30.0000 mL | Freq: Every day | ORAL | Status: DC | PRN
  Administered 2019-06-28: 06:00:00 30 mL via ORAL
  Filled 2019-06-25: qty 30

## 2019-06-25 MED ORDER — ONDANSETRON HCL 4 MG PO TABS
4.0000 mg | ORAL_TABLET | Freq: Four times a day (QID) | ORAL | Status: DC | PRN
  Administered 2019-06-26 – 2019-06-28 (×2): 4 mg via ORAL
  Filled 2019-06-25 (×2): qty 1

## 2019-06-25 MED ORDER — FAMOTIDINE 20 MG PO TABS
20.0000 mg | ORAL_TABLET | Freq: Once | ORAL | Status: AC
  Administered 2019-06-25: 11:00:00 20 mg via ORAL

## 2019-06-25 MED ORDER — PHENYLEPHRINE HCL (PRESSORS) 10 MG/ML IV SOLN
INTRAVENOUS | Status: AC
  Filled 2019-06-25: qty 1

## 2019-06-25 MED ORDER — PHENYLEPHRINE HCL (PRESSORS) 10 MG/ML IV SOLN
INTRAVENOUS | Status: DC | PRN
  Administered 2019-06-25 (×3): 100 ug via INTRAVENOUS

## 2019-06-25 MED ORDER — BUPIVACAINE LIPOSOME 1.3 % IJ SUSP
INTRAMUSCULAR | Status: AC
  Filled 2019-06-25: qty 20

## 2019-06-25 MED ORDER — ALUM & MAG HYDROXIDE-SIMETH 200-200-20 MG/5ML PO SUSP: 30.0000 mL | ORAL | Status: DC | PRN

## 2019-06-25 MED ORDER — ENOXAPARIN SODIUM 40 MG/0.4ML ~~LOC~~ SOLN
40.0000 mg | SUBCUTANEOUS | Status: DC
  Administered 2019-06-26 – 2019-06-28 (×3): 40 mg via SUBCUTANEOUS
  Filled 2019-06-25 (×3): qty 0.4

## 2019-06-25 MED ORDER — SODIUM CHLORIDE 0.9 % IV SOLN
INTRAVENOUS | Status: DC | PRN
  Administered 2019-06-25: 80 mL

## 2019-06-25 MED ORDER — SODIUM CHLORIDE FLUSH 0.9 % IV SOLN
INTRAVENOUS | Status: AC
  Filled 2019-06-25: qty 10

## 2019-06-25 MED ORDER — SODIUM CHLORIDE 0.9 % IV SOLN
INTRAVENOUS | Status: DC
  Administered 2019-06-25: 15:00:00 via INTRAVENOUS

## 2019-06-25 MED ORDER — ACETAMINOPHEN 500 MG PO TABS
1000.0000 mg | ORAL_TABLET | Freq: Four times a day (QID) | ORAL | Status: AC
  Administered 2019-06-25 – 2019-06-26 (×4): 1000 mg via ORAL
  Filled 2019-06-25 (×4): qty 2

## 2019-06-25 MED ORDER — BISOPROLOL-HYDROCHLOROTHIAZIDE 5-6.25 MG PO TABS
1.0000 | ORAL_TABLET | Freq: Every day | ORAL | Status: DC
  Administered 2019-06-25 – 2019-06-27 (×3): 1 via ORAL
  Filled 2019-06-25 (×4): qty 1

## 2019-06-25 MED ORDER — BUPIVACAINE-EPINEPHRINE 0.25% -1:200000 IJ SOLN
INTRAMUSCULAR | Status: DC | PRN
  Administered 2019-06-25: 30 mL

## 2019-06-25 MED ORDER — MAGNESIUM CITRATE PO SOLN
1.0000 | Freq: Once | ORAL | Status: AC | PRN
  Administered 2019-06-28: 13:00:00 1 via ORAL
  Filled 2019-06-25 (×2): qty 296

## 2019-06-25 MED ORDER — CEFAZOLIN SODIUM-DEXTROSE 1-4 GM/50ML-% IV SOLN
1.0000 g | Freq: Four times a day (QID) | INTRAVENOUS | Status: AC
  Administered 2019-06-25 (×2): 1 g via INTRAVENOUS
  Filled 2019-06-25 (×2): qty 50

## 2019-06-25 MED ORDER — LORATADINE 10 MG PO TABS
10.0000 mg | ORAL_TABLET | Freq: Every day | ORAL | Status: DC
  Administered 2019-06-26 – 2019-06-28 (×3): 10 mg via ORAL
  Filled 2019-06-25 (×3): qty 1

## 2019-06-25 MED ORDER — MEPERIDINE HCL 50 MG/ML IJ SOLN
6.2500 mg | INTRAMUSCULAR | Status: DC | PRN
  Administered 2019-06-25: 14:00:00 6.25 mg via INTRAVENOUS

## 2019-06-25 MED ORDER — HYDROMORPHONE HCL 1 MG/ML IJ SOLN
0.5000 mg | INTRAMUSCULAR | Status: DC | PRN
  Administered 2019-06-26 – 2019-06-27 (×3): 1 mg via INTRAVENOUS
  Filled 2019-06-25 (×3): qty 1

## 2019-06-25 MED ORDER — OXYCODONE HCL 5 MG PO TABS
5.0000 mg | ORAL_TABLET | ORAL | Status: DC | PRN
  Filled 2019-06-25 (×2): qty 2

## 2019-06-25 MED ORDER — PHENOL 1.4 % MT LIQD
1.0000 | OROMUCOSAL | Status: DC | PRN
  Filled 2019-06-25: qty 177

## 2019-06-25 MED ORDER — LACTATED RINGERS IV SOLN
INTRAVENOUS | Status: DC
  Administered 2019-06-25: 11:00:00 via INTRAVENOUS

## 2019-06-25 MED ORDER — PROPOFOL 500 MG/50ML IV EMUL
INTRAVENOUS | Status: DC | PRN
  Administered 2019-06-25: 50 ug/kg/min via INTRAVENOUS

## 2019-06-25 MED ORDER — BUPIVACAINE-EPINEPHRINE (PF) 0.25% -1:200000 IJ SOLN
INTRAMUSCULAR | Status: AC
  Filled 2019-06-25: qty 30

## 2019-06-25 MED ORDER — RISAQUAD PO CAPS
1.0000 | ORAL_CAPSULE | Freq: Every day | ORAL | Status: DC
  Administered 2019-06-26 – 2019-06-28 (×3): 1 via ORAL
  Filled 2019-06-25 (×4): qty 1

## 2019-06-25 MED ORDER — BUPIVACAINE HCL (PF) 0.5 % IJ SOLN
INTRAMUSCULAR | Status: DC | PRN
  Administered 2019-06-25: 2.5 mL

## 2019-06-25 MED ORDER — TRANEXAMIC ACID-NACL 1000-0.7 MG/100ML-% IV SOLN
INTRAVENOUS | Status: DC | PRN
  Administered 2019-06-25: 1000 mg via INTRAVENOUS

## 2019-06-25 MED ORDER — MIDAZOLAM HCL 2 MG/2ML IJ SOLN
INTRAMUSCULAR | Status: AC
  Filled 2019-06-25: qty 2

## 2019-06-25 MED ORDER — DIPHENHYDRAMINE HCL 12.5 MG/5ML PO ELIX: 12.5000 mg | ORAL_SOLUTION | ORAL | Status: DC | PRN

## 2019-06-25 MED ORDER — METOCLOPRAMIDE HCL 5 MG/ML IJ SOLN: 5.0000 mg | Freq: Three times a day (TID) | INTRAMUSCULAR | Status: DC | PRN

## 2019-06-25 MED ORDER — METHOCARBAMOL 500 MG PO TABS
500.0000 mg | ORAL_TABLET | Freq: Four times a day (QID) | ORAL | Status: DC | PRN
  Administered 2019-06-25 – 2019-06-27 (×3): 500 mg via ORAL
  Filled 2019-06-25 (×3): qty 1

## 2019-06-25 MED ORDER — CEFAZOLIN SODIUM-DEXTROSE 1-4 GM/50ML-% IV SOLN
1.0000 g | Freq: Four times a day (QID) | INTRAVENOUS | Status: DC
  Filled 2019-06-25 (×2): qty 50

## 2019-06-25 MED ORDER — FENTANYL CITRATE (PF) 100 MCG/2ML IJ SOLN: 25.0000 ug | INTRAMUSCULAR | Status: DC | PRN

## 2019-06-25 MED ORDER — TRAMADOL HCL 50 MG PO TABS
50.0000 mg | ORAL_TABLET | Freq: Four times a day (QID) | ORAL | Status: DC
  Administered 2019-06-25 – 2019-06-28 (×11): 50 mg via ORAL
  Filled 2019-06-25 (×12): qty 1

## 2019-06-25 MED ORDER — ONDANSETRON HCL 4 MG/2ML IJ SOLN
4.0000 mg | Freq: Four times a day (QID) | INTRAMUSCULAR | Status: DC | PRN
  Administered 2019-06-26 – 2019-06-27 (×2): 4 mg via INTRAVENOUS
  Filled 2019-06-25 (×2): qty 2

## 2019-06-25 MED ORDER — CEFAZOLIN SODIUM-DEXTROSE 2-4 GM/100ML-% IV SOLN
INTRAVENOUS | Status: AC
  Filled 2019-06-25: qty 100

## 2019-06-25 SURGICAL SUPPLY — 60 items
BLADE SAGITTAL AGGR TOOTH XLG (BLADE) ×3 IMPLANT
BNDG COHESIVE 6X5 TAN STRL LF (GAUZE/BANDAGES/DRESSINGS) ×9 IMPLANT
CANISTER SUCT 1200ML W/VALVE (MISCELLANEOUS) ×3 IMPLANT
CANISTER WOUND CARE 500ML ATS (WOUND CARE) ×3 IMPLANT
CHLORAPREP W/TINT 26 (MISCELLANEOUS) ×3 IMPLANT
COVER WAND RF STERILE (DRAPES) ×3 IMPLANT
DRAPE C-ARM XRAY 36X54 (DRAPES) ×3 IMPLANT
DRAPE INCISE IOBAN 66X60 STRL (DRAPES) IMPLANT
DRAPE POUCH INSTRU U-SHP 10X18 (DRAPES) ×3 IMPLANT
DRAPE SHEET LG 3/4 BI-LAMINATE (DRAPES) ×9 IMPLANT
DRAPE TABLE BACK 80X90 (DRAPES) ×3 IMPLANT
DRESSING SURGICEL FIBRLLR 1X2 (HEMOSTASIS) ×2 IMPLANT
DRSG OPSITE POSTOP 4X8 (GAUZE/BANDAGES/DRESSINGS) ×6 IMPLANT
DRSG SURGICEL FIBRILLAR 1X2 (HEMOSTASIS) ×6
ELECT BLADE 6.5 EXT (BLADE) ×3 IMPLANT
ELECT REM PT RETURN 9FT ADLT (ELECTROSURGICAL) ×3
ELECTRODE REM PT RTRN 9FT ADLT (ELECTROSURGICAL) ×1 IMPLANT
GLOVE BIOGEL PI IND STRL 9 (GLOVE) ×1 IMPLANT
GLOVE BIOGEL PI INDICATOR 9 (GLOVE) ×2
GLOVE SURG SYN 9.0  PF PI (GLOVE) ×4
GLOVE SURG SYN 9.0 PF PI (GLOVE) ×2 IMPLANT
GOWN SRG 2XL LVL 4 RGLN SLV (GOWNS) ×1 IMPLANT
GOWN STRL NON-REIN 2XL LVL4 (GOWNS) ×2
GOWN STRL REUS W/ TWL LRG LVL3 (GOWN DISPOSABLE) ×1 IMPLANT
GOWN STRL REUS W/TWL LRG LVL3 (GOWN DISPOSABLE) ×2
HEMOVAC 400CC 10FR (MISCELLANEOUS) IMPLANT
HIP FEM HD S 28 (Head) ×2 IMPLANT
HOLDER FOLEY CATH W/STRAP (MISCELLANEOUS) ×3 IMPLANT
HOOD PEEL AWAY FLYTE STAYCOOL (MISCELLANEOUS) ×3 IMPLANT
KIT PREVENA INCISION MGT 13 (CANNISTER) ×3 IMPLANT
LINER DBL MOB SZ 0 52MM (Liner) ×2 IMPLANT
MAT ABSORB  FLUID 56X50 GRAY (MISCELLANEOUS) ×2
MAT ABSORB FLUID 56X50 GRAY (MISCELLANEOUS) ×1 IMPLANT
NDL SAFETY ECLIPSE 18X1.5 (NEEDLE) ×1 IMPLANT
NDL SPNL 20GX3.5 QUINCKE YW (NEEDLE) ×2 IMPLANT
NEEDLE HYPO 18GX1.5 SHARP (NEEDLE) ×2
NEEDLE SPNL 20GX3.5 QUINCKE YW (NEEDLE) ×6 IMPLANT
NS IRRIG 1000ML POUR BTL (IV SOLUTION) ×3 IMPLANT
PACK HIP COMPR (MISCELLANEOUS) ×3 IMPLANT
SCALPEL PROTECTED #10 DISP (BLADE) ×6 IMPLANT
SHELL ACETABULAR SZ 52 DM (Shell) ×2 IMPLANT
SOL PREP PVP 2OZ (MISCELLANEOUS) ×3
SOLUTION PREP PVP 2OZ (MISCELLANEOUS) ×1 IMPLANT
SPONGE DRAIN TRACH 4X4 STRL 2S (GAUZE/BANDAGES/DRESSINGS) ×3 IMPLANT
STAPLER SKIN PROX 35W (STAPLE) ×3 IMPLANT
STEM FEMORAL SZ2 STD COLLARED (Stem) ×2 IMPLANT
STRAP SAFETY 5IN WIDE (MISCELLANEOUS) ×3 IMPLANT
SUT DVC 2 QUILL PDO  T11 36X36 (SUTURE) ×2
SUT DVC 2 QUILL PDO T11 36X36 (SUTURE) ×1 IMPLANT
SUT SILK 0 (SUTURE) ×2
SUT SILK 0 30XBRD TIE 6 (SUTURE) ×1 IMPLANT
SUT V-LOC 90 ABS DVC 3-0 CL (SUTURE) ×3 IMPLANT
SUT VIC AB 1 CT1 36 (SUTURE) ×3 IMPLANT
SYR 20CC LL (SYRINGE) ×3 IMPLANT
SYR 30ML LL (SYRINGE) ×3 IMPLANT
SYR 50ML LL SCALE MARK (SYRINGE) ×6 IMPLANT
SYR BULB IRRIG 60ML STRL (SYRINGE) ×3 IMPLANT
TAPE MICROFOAM 4IN (TAPE) ×3 IMPLANT
TOWEL OR 17X26 4PK STRL BLUE (TOWEL DISPOSABLE) ×3 IMPLANT
TRAY FOLEY MTR SLVR 16FR STAT (SET/KITS/TRAYS/PACK) ×3 IMPLANT

## 2019-06-25 NOTE — Anesthesia Procedure Notes (Signed)
Date/Time: 06/25/2019 12:15 PM Performed by: Caryl Asp, CRNA Oxygen Delivery Method: Simple face mask Dental Injury: Teeth and Oropharynx as per pre-operative assessment

## 2019-06-25 NOTE — Anesthesia Post-op Follow-up Note (Signed)
Anesthesia QCDR form completed.        

## 2019-06-25 NOTE — H&P (Signed)
Reviewed paper H+P, will be scanned into chart. No changes noted.  

## 2019-06-25 NOTE — Anesthesia Preprocedure Evaluation (Signed)
Anesthesia Evaluation  Patient identified by MRN, date of birth, ID band Patient awake    Reviewed: Allergy & Precautions, NPO status , Patient's Chart, lab work & pertinent test results  History of Anesthesia Complications (+) PONV and history of anesthetic complications  Airway Mallampati: II  TM Distance: >3 FB Neck ROM: Full    Dental no notable dental hx.    Pulmonary neg pulmonary ROS, neg sleep apnea, neg COPD,    breath sounds clear to auscultation- rhonchi (-) wheezing      Cardiovascular hypertension, Pt. on medications (-) CAD, (-) Past MI, (-) Cardiac Stents and (-) CABG  Rhythm:Regular Rate:Normal - Systolic murmurs and - Diastolic murmurs    Neuro/Psych neg Seizures negative neurological ROS  negative psych ROS   GI/Hepatic negative GI ROS, Neg liver ROS,   Endo/Other  negative endocrine ROSneg diabetes  Renal/GU negative Renal ROS     Musculoskeletal  (+) Arthritis ,   Abdominal (+) - obese,   Peds  Hematology negative hematology ROS (+)   Anesthesia Other Findings Past Medical History: No date: Arthritis     Comment:  hips No date: Complication of anesthesia No date: Hx of degenerative disc disease No date: Hypertension No date: PONV (postoperative nausea and vomiting)   Reproductive/Obstetrics                             Lab Results  Component Value Date   WBC 6.6 06/18/2019   HGB 13.3 06/18/2019   HCT 40.3 06/18/2019   MCV 94.6 06/18/2019   PLT 339 06/18/2019    Anesthesia Physical Anesthesia Plan  ASA: II  Anesthesia Plan: Spinal   Post-op Pain Management:    Induction:   PONV Risk Score and Plan: 3 and Propofol infusion  Airway Management Planned: Natural Airway  Additional Equipment:   Intra-op Plan:   Post-operative Plan:   Informed Consent: I have reviewed the patients History and Physical, chart, labs and discussed the procedure  including the risks, benefits and alternatives for the proposed anesthesia with the patient or authorized representative who has indicated his/her understanding and acceptance.     Dental advisory given  Plan Discussed with: CRNA and Anesthesiologist  Anesthesia Plan Comments:         Anesthesia Quick Evaluation

## 2019-06-25 NOTE — Transfer of Care (Signed)
Immediate Anesthesia Transfer of Care Note  Patient: Laura Mckay  Procedure(s) Performed: TOTAL HIP ARTHROPLASTY LEFT (Left Hip)  Patient Location: PACU  Anesthesia Type:Spinal  Level of Consciousness: awake, alert  and oriented  Airway & Oxygen Therapy: Patient Spontanous Breathing  Post-op Assessment: Report given to RN and Post -op Vital signs reviewed and stable  Post vital signs: Reviewed and stable  Last Vitals:  Vitals Value Taken Time  BP 134/88 06/25/19 1343  Temp    Pulse 82 06/25/19 1343  Resp 18 06/25/19 1343  SpO2 100 % 06/25/19 1343  Vitals shown include unvalidated device data.  Last Pain:  Vitals:   06/25/19 1036  TempSrc: Tympanic  PainSc: 5          Complications: No apparent anesthesia complications

## 2019-06-25 NOTE — Anesthesia Procedure Notes (Signed)
Spinal  Patient location during procedure: OR End time: 06/25/2019 12:09 PM Preanesthetic Checklist Completed: patient identified, site marked, surgical consent, pre-op evaluation, timeout performed, IV checked, risks and benefits discussed and monitors and equipment checked Spinal Block Patient position: sitting Prep: Betadine Patient monitoring: heart rate, continuous pulse ox, blood pressure and cardiac monitor Approach: midline Location: L4-5 Injection technique: single-shot Needle Needle type: Whitacre and Introducer  Needle gauge: 24 G Needle length: 9 cm Additional Notes Negative paresthesia. Negative blood return. Positive free-flowing CSF. Expiration date of kit checked and confirmed. Patient tolerated procedure well, without complications.

## 2019-06-25 NOTE — Op Note (Signed)
06/25/2019  1:42 PM  PATIENT:  Laura Mckay  63 y.o. female  PRE-OPERATIVE DIAGNOSIS:  PRIMARY OSTEOARTHRITIS OF LEFT HIP  POST-OPERATIVE DIAGNOSIS:  PRIMARY OSTEOARTHRITIS OF LEFT HIP  PROCEDURE:  Procedure(s): TOTAL HIP ARTHROPLASTY LEFT (Left)  SURGEON: Laurene Footman, MD  ASSISTANTS: None  ANESTHESIA:   spinal  EBL:  Total I/O In: 600 [I.V.:600] Out: 200 [Urine:100; Blood:100]  BLOOD ADMINISTERED:none  DRAINS: none   LOCAL MEDICATIONS USED:  MARCAINE    and OTHER Exparel  SPECIMEN:  Source of Specimen:  Left femoral head  DISPOSITION OF SPECIMEN:  PATHOLOGY  COUNTS:  YES  TOURNIQUET:  * No tourniquets in log *  IMPLANTS: Medacta AMIS 2 standard, 52 mm Mpact DM cup and liner with metal S 28 mm head  DICTATION: .Dragon Dictation   The patient was brought to the operating room and after spinal anesthesia was obtained patient was placed on the operative table with the ipsilateral foot into the Medacta attachment, contralateral leg on a well-padded table. C-arm was brought in and preop template x-ray taken. After prepping and draping in usual sterile fashion appropriate patient identification and timeout procedures were completed. Anterior approach to the hip was obtained and centered over the greater trochanter and TFL muscle. The subcutaneous tissue was incised hemostasis being achieved by electrocautery. TFL fascia was incised and the muscle retracted laterally deep retractor placed. The lateral femoral circumflex vessels were identified and ligated. The anterior capsule was exposed and a capsulotomy performed. The neck was identified and a femoral neck cut carried out with a saw. The head was removed without difficulty and showed sclerotic femoral head and acetabulum. Reaming was carried out to 50 mm and a 52 mm cup trial gave appropriate tightness to the acetabular component a 52 DM cup was impacted into position. The leg was then externally rotated and  ischiofemoral and pubofemoral releases carried out. The femur was sequentially broached to a size 2, size 2 standard with S head trials were placed and the final components chosen. The 2 standard stem was inserted along with a metal S 28 mm head and 52 mm liner. The hip was reduced and was stable the wound was thoroughly irrigated with fibrillar placed along the posterior capsule and medial neck. The deep fascia ws closed using a heavy Quill after infiltration of 30 cc of quarter percent Sensorcaine with epinephrine.3-0 V-loc to close the skin with skin staples.  Incisional wound VAC applied and patient was sent to recovery in stable condition.   PLAN OF CARE: Admit to inpatient

## 2019-06-26 LAB — CBC
HCT: 30.5 % — ABNORMAL LOW (ref 36.0–46.0)
Hemoglobin: 10.2 g/dL — ABNORMAL LOW (ref 12.0–15.0)
MCH: 31.5 pg (ref 26.0–34.0)
MCHC: 33.4 g/dL (ref 30.0–36.0)
MCV: 94.1 fL (ref 80.0–100.0)
Platelets: 249 10*3/uL (ref 150–400)
RBC: 3.24 MIL/uL — ABNORMAL LOW (ref 3.87–5.11)
RDW: 12.9 % (ref 11.5–15.5)
WBC: 6.4 10*3/uL (ref 4.0–10.5)
nRBC: 0 % (ref 0.0–0.2)

## 2019-06-26 LAB — BASIC METABOLIC PANEL
Anion gap: 6 (ref 5–15)
BUN: 13 mg/dL (ref 8–23)
CO2: 23 mmol/L (ref 22–32)
Calcium: 8.1 mg/dL — ABNORMAL LOW (ref 8.9–10.3)
Chloride: 109 mmol/L (ref 98–111)
Creatinine, Ser: 0.69 mg/dL (ref 0.44–1.00)
GFR calc Af Amer: >60 mL/min (ref >60)
GFR calc non Af Amer: >60 mL/min (ref >60)
Glucose, Bld: 141 mg/dL — ABNORMAL HIGH (ref 70–99)
Potassium: 3.5 mmol/L (ref 3.5–5.1)
Sodium: 138 mmol/L (ref 135–145)

## 2019-06-26 MED ORDER — HYDROCODONE-ACETAMINOPHEN 10-325 MG PO TABS
1.0000 | ORAL_TABLET | ORAL | Status: DC | PRN
  Administered 2019-06-26: 1 via ORAL
  Administered 2019-06-26 – 2019-06-27 (×3): 2 via ORAL
  Administered 2019-06-27: 1 via ORAL
  Filled 2019-06-26: qty 1
  Filled 2019-06-26 (×4): qty 2

## 2019-06-26 MED ORDER — OXYCODONE HCL 5 MG PO TABS: 5.0000 mg | ORAL_TABLET | ORAL | 0 refills | Status: DC | PRN

## 2019-06-26 MED ORDER — HYDROCODONE-ACETAMINOPHEN 10-325 MG PO TABS: 1.0000 | ORAL_TABLET | ORAL | 0 refills | Status: DC | PRN

## 2019-06-26 MED ORDER — ENOXAPARIN SODIUM 40 MG/0.4ML ~~LOC~~ SOLN: 40.0000 mg | SUBCUTANEOUS | 0 refills | Status: DC

## 2019-06-26 MED ORDER — TRAMADOL HCL 50 MG PO TABS: 50.0000 mg | ORAL_TABLET | Freq: Four times a day (QID) | ORAL | 0 refills | Status: DC | PRN

## 2019-06-26 NOTE — TOC Benefit Eligibility Note (Signed)
Transition of Care [facility]) Benefit Eligibility Note    Patient Details  Name: Laura Mckay MRN: [id number] Date of Birth: [birth year]   Medication/Dose: Lovenox 40mg  once daily for 14 days.  Covered?: No  Prescription Coverage Preferred Pharmacy: [facility]  Spoke with Person/Company/Phone Number:: Rodman Pickle with [employer] at [phone]  Prior Approval: Yes(PA required for name brand: [phone])  Deductible: (No deductible on plan per rep.)  Additional Notes: Generic Enoxaparin covered with no PA required.  Estimated copay: $10Dannette Barbara Phone Number: [phone] or [phone] 06/26/2019, 9:49 AM

## 2019-06-26 NOTE — Plan of Care (Signed)

## 2019-06-26 NOTE — Progress Notes (Signed)
   Subjective: 1 Day Post-Op Procedure(s) (LRB): TOTAL HIP ARTHROPLASTY LEFT (Left) Patient reports pain as mild.   Patient is well, and has had no acute complaints or problems We will start therapy today.  Plan is to go Home after hospital stay. no nausea and no vomiting Patient denies any chest pains or shortness of breath. The most part resting well.  Did have episode of pain last night that was controlled with oral medication. Does complain of little tingling sensation under the toes of the left foot  Objective: Vital signs in last 24 hours: Temp:  [97.2 F (36.2 C)-98.9 F (37.2 C)] 98.9 F (37.2 C) (07/01 0430) Pulse Rate:  [55-93] 85 (07/01 0430) Resp:  [13-21] 16 (07/01 0430) BP: (93-157)/(49-87) 99/60 (07/01 0430) SpO2:  [95 %-100 %] 95 % (07/01 0430) FiO2 (%):  [21 %] 21 % (06/30 1514) Weight:  [70.6 kg] 70.6 kg (06/30 1517) Wound VAC in place Heels are non tender and elevated off the bed using rolled towels Some diffuse erythematous tissue to the medial upper thigh  Intake/Output from previous day: 06/30 0701 - 07/01 0700 In: 2356.4 [I.V.:2256.4; IV Piggyback:100] Out: 3400 [Urine:3300; Blood:100] Intake/Output this shift: Total I/O In: 890.4 [I.V.:890.4] Out: 2800 [Urine:2800]  Recent Labs    06/25/19 1626 06/26/19 0458  HGB 12.3 10.2*   Recent Labs    06/25/19 1626 06/26/19 0458  WBC 13.9* 6.4  RBC 3.89 3.24*  HCT 37.5 30.5*  PLT 273 249   Recent Labs    06/25/19 1626 06/26/19 0458  NA  --  138  K  --  3.5  CL  --  109  CO2  --  23  BUN  --  13  CREATININE 0.76 0.69  GLUCOSE  --  141*  CALCIUM  --  8.1*   No results for input(s): LABPT, INR in the last 72 hours.  EXAM General - Patient is Alert, Appropriate and Oriented Extremity - Neurologically intact Neurovascular intact Sensation intact distally Intact pulses distally Dorsiflexion/Plantar flexion intact Compartment soft Dressing - Wound VAC in place Motor Function - intact,  moving foot and toes well on exam.  Able to do straight leg raise on  Past Medical History:  Diagnosis Date  . Arthritis    hips  . Complication of anesthesia   . Hx of degenerative disc disease   . Hypertension   . PONV (postoperative nausea and vomiting)     Assessment/Plan: 1 Day Post-Op Procedure(s) (LRB): TOTAL HIP ARTHROPLASTY LEFT (Left) Active Problems:   Status post total hip replacement, left  Estimated body mass index is 25.9 kg/m as calculated from the following:   Height as of this encounter: '5\' 5"'$  (1.651 m).   Weight as of this encounter: 70.6 kg. Advance diet Up with therapy D/C IV fluids Plan for discharge tomorrow Discharge home with home health  Labs: Met B within normal limits.  Hemoglobin 10.2 DVT Prophylaxis - Lovenox, Foot Pumps and TED hose Weight-Bearing as tolerated to left leg D/C O2 and Pulse OX and try on Room Air Begin working on bowel movement   R. [place] 06/26/2019, 6:48 AM

## 2019-06-26 NOTE — Progress Notes (Signed)
Physical Therapy Treatment Patient Details Name: Laura Mckay MRN: [id number] DOB: 19-Oct-1956 Today's Date: 06/26/2019    History of Present Illness Patient is a 63 year old female s/p Left THA. Patient is WBAT. PMH to include OA, HTN, DDD.    PT Comments    Patient received in recliner, reports she needs to go to the bathroom. Continues to have significant pain with any movement. Requires increased time for all mobility. Patient performed sit to stand from recliner with mod assist, ambulated a few steps to [facility] then ambulated another 12 feet ( 15 total) with RW and min guard assist. Requires mod assist and increased time with sit to supine. Unable to raise legs onto bed without assistance.  Patient is very pain limited crying occasionally during session.  Patient will benefit from continued PT while here to improve pain, independence with mobility and strength. Patient having a difficult time currently, but with a couple more days in acute care she will hopefully be able to return home with spouse assistance.      Follow Up Recommendations  Home health PT     Equipment Recommendations  3in1 (PT)    Recommendations for Other Services       Precautions / Restrictions Precautions Precautions: Fall;Anterior Hip Precaution Booklet Issued: No Restrictions Weight Bearing Restrictions: Yes LLE Weight Bearing: Weight bearing as tolerated    Mobility  Bed Mobility Overal bed mobility: Needs Assistance Bed Mobility: Sit to Supine       Sit to supine: Mod assist   General bed mobility comments: deferred, received up in recliner  Transfers Overall transfer level: Needs assistance Equipment used: Rolling walker (2 wheeled) Transfers: Sit to/from Stand Sit to Stand: Mod assist;From elevated surface         General transfer comment: deferred 2/2 pt feeling "loopy" following pain medication administration  Ambulation/Gait Ambulation/Gait assistance: Min guard Gait  Distance (Feet): 15 Feet   Gait Pattern/deviations: Step-to pattern;Decreased step length - left;Decreased step length - right;Decreased stride length;Shuffle Gait velocity: decreased   General Gait Details: pain with ambulation, slow steady gait   Stairs             Wheelchair Mobility    Modified Rankin (Stroke Patients Only)       Balance Overall balance assessment: Modified Independent Sitting-balance support: Feet supported Sitting balance-Leahy Scale: Good     Standing balance support: Bilateral upper extremity supported Standing balance-Leahy Scale: Good                              Cognition Arousal/Alertness: Awake/alert Behavior During Therapy: WFL for tasks assessed/performed Overall Cognitive Status: Within Functional Limits for tasks assessed                                        Exercises Total Joint Exercises Ankle Circles/Pumps: AROM;10 reps;Both Other Exercises Other Exercises: pt instructed in AE/DME for ADL, falls prevention strategies, home/routines modifications, and compression stockings mgt    General Comments        Pertinent Vitals/Pain Pain Assessment: Faces Pain Score: 2  Faces Pain Scale: Hurts worst Pain Location: L hip Pain Descriptors / Indicators: Aching;Burning;Operative site guarding;Crying;Guarding;Grimacing Pain Intervention(s): Limited activity within patient's tolerance;Repositioned;Monitored during session;Premedicated before session    Home Living Family/patient expects to be discharged to:: Private residence Living Arrangements: Spouse/significant other Available Help  at Discharge: Family Type of Home: House Home Access: Stairs to enter Entrance Stairs-Rails: None(right and left) Home Layout: One level Home Equipment: Environmental consultantWalker - 2 wheels;Shower seat      Prior Function Level of Independence: Needs assistance  Gait / Transfers Assistance Needed: required assistance from husband for  transfers ADL's / Homemaking Assistance Needed: required minimal assistance from spouse for LB ADL 2/2 hip pain Comments: states she just reached for furniture for support, did not use walker prior to admission   PT Goals (current goals can now be found in the care plan section) Acute Rehab PT Goals Patient Stated Goal: to return home PT Goal Formulation: With patient Time For Goal Achievement: 06/28/19 Potential to Achieve Goals: Fair    Frequency    BID      PT Plan Current plan remains appropriate    Co-evaluation              AM-PAC PT "6 Clicks" Mobility   Outcome Measure  Help needed turning from your back to your side while in a flat bed without using bedrails?: A Lot Help needed moving from lying on your back to sitting on the side of a flat bed without using bedrails?: A Lot Help needed moving to and from a bed to a chair (including a wheelchair)?: A Lot Help needed standing up from a chair using your arms (e.g., wheelchair or bedside chair)?: A Lot Help needed to walk in hospital room?: A Lot Help needed climbing 3-5 steps with a railing? : A Lot 6 Click Score: 12    End of Session Equipment Utilized During Treatment: Gait belt Activity Tolerance: Patient limited by pain Patient left: in bed;with bed alarm set;with call bell/phone within reach Nurse Communication: Mobility status PT Visit Diagnosis: Muscle weakness (generalized) (M62.81);Difficulty in walking, not elsewhere classified (R26.2);Pain Pain - Right/Left: Left(bilateral) Pain - part of body: Hip     Time: 1300-1355 PT Time Calculation (min) (ACUTE ONLY): 55 min  Charges:  $Gait Training: 23-37 mins $Therapeutic Activity: 23-37 mins                     Aleyza Salmi, PT, GCS 06/26/19,2:12 PM

## 2019-06-26 NOTE — Progress Notes (Signed)
Patient pain is poorly controlled. Administered PRN Hydrocodone and pain was unrelieved. Administered Dilaudid and this has helped with her pain. She is very anxious and inconsolable. She is currently refusing to get out of bed to go to the restroom. Placing a purewick for now, and will reassess her ability to get up. Will continue to monitor.

## 2019-06-26 NOTE — Evaluation (Signed)
Occupational Therapy Evaluation Patient Details Name: Laura Mckay MRN: [id number] DOB: Mar 26, 1956 Today's Date: 06/26/2019    History of Present Illness Patient is a 64 year old female s/p Left THA. Patient is WBAT. PMH to include OA, HTN, DDD.   Clinical Impression   Pt seen for OT evaluation this date, POD#1 from above surgery. Pt was requiring assist from spouse for transfers and LB ADL tasks prior to surgery 2/2 hip pain. Pt is eager to return to PLOF with less pain and improved safety and independence. Pt currently requires minimal assist for LB dressing and bathing while in seated position due to pain and limited AROM of L hip. Pt reports feeling "loopy" after taking pain medications so functional transfers for ADL tasks deferred during session. Pt instructed in self care skills, falls prevention strategies, home/routines modifications, DME/AE for LB bathing and dressing tasks, and compression stocking mgt strategies. Handout provided. Pt would benefit from additional instruction in self care skills and techniques to help maintain precautions with or without assistive devices to support recall and carryover prior to discharge. Do not anticipate skilled OT services following discharge but will continue to see while hospitalized.     Follow Up Recommendations  No OT follow up    Equipment Recommendations  None recommended by OT    Recommendations for Other Services       Precautions / Restrictions Precautions Precautions: Fall;Anterior Hip Precaution Booklet Issued: No Restrictions Weight Bearing Restrictions: Yes LLE Weight Bearing: Weight bearing as tolerated      Mobility Bed Mobility     General bed mobility comments: deferred, received up in recliner  Transfers         General transfer comment: deferred 2/2 pt feeling "loopy" following pain medication administration    Balance Overall balance assessment: Needs assistance Sitting-balance support: Single  extremity supported;Feet supported Sitting balance-Leahy Scale: Good     Standing balance support: Bilateral upper extremity supported Standing balance-Leahy Scale: Good                             ADL either performed or assessed with clinical judgement   ADL                                         General ADL Comments: Min A for LB ADL, Max A for compression stocking mgt - spouse able to assist     Vision Patient Visual Report: No change from baseline       Perception     Praxis      Pertinent Vitals/Pain Pain Assessment: 0-10 Pain Score: 2  Pain Location: L hip Pain Descriptors / Indicators: Aching Pain Intervention(s): Limited activity within patient's tolerance;Monitored during session;Premedicated before session     Hand Dominance     Extremity/Trunk Assessment Upper Extremity Assessment Upper Extremity Assessment: Overall WFL for tasks assessed   Lower Extremity Assessment Lower Extremity Assessment: Generalized weakness;LLE deficits/detail;RLE deficits/detail RLE Deficits / Details: Pain due to OA LLE Deficits / Details: pain from surgery LLE: Unable to fully assess due to pain LLE Sensation: WNL LLE Coordination: decreased gross motor   Cervical / Trunk Assessment Cervical / Trunk Assessment: Normal   Communication Communication Communication: No difficulties   Cognition Arousal/Alertness: Suspect due to medications(pt reports feeling "loopy") Behavior During Therapy: WFL for tasks assessed/performed Overall Cognitive Status: Within Functional  Limits for tasks assessed                                     General Comments       Exercises Other Exercises Other Exercises: pt instructed in AE/DME for ADL, falls prevention strategies, home/routines modifications, and compression stockings mgt   Shoulder Instructions      Home Living Family/patient expects to be discharged to:: Private residence Living  Arrangements: Spouse/significant other Available Help at Discharge: Family Type of Home: House Home Access: Stairs to enter Secretary/administratorntrance Stairs-Number of Steps: 16 Entrance Stairs-Rails: Right;Left Home Layout: One level     Bathroom Shower/Tub: Producer, television/film/videoWalk-in shower   Bathroom Toilet: Standard     Home Equipment: Environmental consultantWalker - 2 wheels;Shower seat          Prior Functioning/Environment Level of Independence: Needs assistance  Gait / Transfers Assistance Needed: required assistance from husband for transfers ADL's / Homemaking Assistance Needed: required minimal assistance from spouse for LB ADL 2/2 hip pain   Comments: states she just reached for furniture for support, did not use walker prior to admission        OT Problem List: Decreased strength;Decreased range of motion;Pain;Decreased knowledge of use of DME or AE;Impaired balance (sitting and/or standing)      OT Treatment/Interventions: Self-care/ADL training;Therapeutic exercise;Therapeutic activities;DME and/or AE instruction;Patient/family education;Balance training    OT Goals(Current goals can be found in the care plan section) Acute Rehab OT Goals Patient Stated Goal: to return home OT Goal Formulation: With patient Time For Goal Achievement: 07/10/19 Potential to Achieve Goals: Good ADL Goals Pt Will Perform Lower Body Dressing: sit to/from stand;with adaptive equipment;with min guard assist Pt Will Transfer to Toilet: ambulating;with supervision(LRAD for amb, BSC over toilet) Additional ADL Goal #1: Pt will independently instruct family in compression stocking mgt.  OT Frequency: Min 1X/week   Barriers to D/C:            Co-evaluation              AM-PAC OT "6 Clicks" Daily Activity     Outcome Measure Help from another person eating meals?: None Help from another person taking care of personal grooming?: None Help from another person toileting, which includes using toliet, bedpan, or urinal?: A  Little Help from another person bathing (including washing, rinsing, drying)?: A Little Help from another person to put on and taking off regular upper body clothing?: None Help from another person to put on and taking off regular lower body clothing?: A Little 6 Click Score: 21   End of Session Nurse Communication: Other (comment)(nurse tech - more ice for ice pack)  Activity Tolerance: Patient tolerated treatment well Patient left: in chair;with call bell/phone within reach;with chair alarm set  OT Visit Diagnosis: Other abnormalities of gait and mobility (R26.89);Pain Pain - Right/Left: Left Pain - part of body: Hip                Time: [id number] OT Time Calculation (min): 18 min Charges:  OT General Charges $OT Visit: 1 Visit OT Evaluation $OT Eval Low Complexity: 1 Low OT Treatments $Self Care/Home Management : 8-22 mins  Richrd PrimeJamie Stiller, MPH, MS, OTR/L ascom [phone] 06/26/19, 1:05 PM

## 2019-06-26 NOTE — Evaluation (Signed)
Physical Therapy Evaluation Patient Details Name: Laura Mckay MRN: [id number] DOB: 25-May-1956 Today's Date: 06/26/2019   History of Present Illness  Patient is a 63 year old female s/p Left THA. Patient is WBAT. PMH to include OA, HTN, DDD.  Clinical Impression  Patient received in bed, pleasant, alert, talkative. Reports she had a horrible night with a lot of pain. Patient reporting 4/10 pain at this session, premedicated. Patient requires mod assist with LE mobility in the bed. Requires mod assist with sit to stand transfer and increased time. She reports she has a bad right hip as well that she is planning to have replaced when able. Patient has nausea and dizziness with mobility therefore ambulation limited to recliner. Patient will benefit from continued skilled PT to address her functional limitations, pain and weakness.     Follow Up Recommendations Home health PT    Equipment Recommendations  3in1 (PT)    Recommendations for Other Services       Precautions / Restrictions Precautions Precautions: Fall;Anterior Hip Precaution Booklet Issued: No Restrictions Weight Bearing Restrictions: Yes LLE Weight Bearing: Weight bearing as tolerated      Mobility  Bed Mobility Overal bed mobility: Needs Assistance Bed Mobility: Supine to Sit     Supine to sit: Mod assist     General bed mobility comments: requires assistance to being B LEs off bed, HOB elevated, hand held mod assist to raise trunk to sitting position.  Transfers Overall transfer level: Needs assistance Equipment used: Rolling walker (2 wheeled) Transfers: Sit to/from Stand Sit to Stand: Mod assist;From elevated surface         General transfer comment: increased time required to perform sit to stand due to pain  Ambulation/Gait Ambulation/Gait assistance: Min guard Gait Distance (Feet): 5 Feet Assistive device: Rolling walker (2 wheeled) Gait Pattern/deviations: Step-to pattern;Decreased step  length - left;Decreased step length - right Gait velocity: decreased   General Gait Details: pain with ambulation, slow steady gait  Stairs            Wheelchair Mobility    Modified Rankin (Stroke Patients Only)       Balance Overall balance assessment: Needs assistance Sitting-balance support: Single extremity supported;Feet supported Sitting balance-Leahy Scale: Good     Standing balance support: Bilateral upper extremity supported Standing balance-Leahy Scale: Good                               Pertinent Vitals/Pain Pain Assessment: 0-10 Pain Score: 4  Pain Descriptors / Indicators: Aching;Burning;Operative site guarding;Discomfort;Guarding;Grimacing Pain Intervention(s): Limited activity within patient's tolerance;Monitored during session;Repositioned;Premedicated before session    Home Living Family/patient expects to be discharged to:: Private residence Living Arrangements: Spouse/significant other Available Help at Discharge: Family Type of Home: House Home Access: Stairs to enter   Technical brewer of Steps: 16 Home Layout: One level Home Equipment: Environmental consultant - 2 wheels;Shower seat      Prior Function Level of Independence: Needs assistance   Gait / Transfers Assistance Needed: required assistance from husband for transfers  ADL's / Homemaking Assistance Needed: required assistance  Comments: states she just reached for furniture for support, did not use walker prior to admission     Hand Dominance        Extremity/Trunk Assessment   Upper Extremity Assessment Upper Extremity Assessment: Overall WFL for tasks assessed    Lower Extremity Assessment Lower Extremity Assessment: Generalized weakness;LLE deficits/detail;RLE deficits/detail RLE Deficits / Details:  Pain due to OA LLE Deficits / Details: pain from surgery LLE: Unable to fully assess due to pain LLE Sensation: WNL LLE Coordination: decreased gross motor     Cervical / Trunk Assessment Cervical / Trunk Assessment: Normal  Communication   Communication: No difficulties  Cognition Arousal/Alertness: Awake/alert Behavior During Therapy: WFL for tasks assessed/performed Overall Cognitive Status: Within Functional Limits for tasks assessed                                        General Comments      Exercises Total Joint Exercises Ankle Circles/Pumps: AROM;10 reps;Both   Assessment/Plan    PT Assessment Patient needs continued PT services  PT Problem List Decreased strength;Decreased mobility;Decreased range of motion;Decreased activity tolerance;Decreased knowledge of precautions;Pain;Decreased balance       PT Treatment Interventions Therapeutic activities;Gait training;Therapeutic exercise;Patient/family education;Stair training;Balance training;Functional mobility training;Neuromuscular re-education    PT Goals (Current goals can be found in the Care Plan section)  Acute Rehab PT Goals Patient Stated Goal: to return home PT Goal Formulation: With patient Time For Goal Achievement: 06/28/19 Potential to Achieve Goals: Good    Frequency BID   Barriers to discharge        Co-evaluation               AM-PAC PT "6 Clicks" Mobility  Outcome Measure Help needed turning from your back to your side while in a flat bed without using bedrails?: A Lot Help needed moving from lying on your back to sitting on the side of a flat bed without using bedrails?: A Lot Help needed moving to and from a bed to a chair (including a wheelchair)?: A Lot Help needed standing up from a chair using your arms (e.g., wheelchair or bedside chair)?: A Lot Help needed to walk in hospital room?: A Lot Help needed climbing 3-5 steps with a railing? : A Lot 6 Click Score: 12    End of Session Equipment Utilized During Treatment: Gait belt Activity Tolerance: Patient limited by pain;Other (comment)(patient feeling nauseated, dizzy  with mobility) Patient left: in chair;with call bell/phone within reach Nurse Communication: Mobility status PT Visit Diagnosis: Muscle weakness (generalized) (M62.81);Difficulty in walking, not elsewhere classified (R26.2);Pain Pain - Right/Left: (bilateral hips) Pain - part of body: Hip    Time: 0915-1001 PT Time Calculation (min) (ACUTE ONLY): 46 min   Charges:   PT Evaluation $PT Eval Moderate Complexity: 1 Mod PT Treatments $Gait Training: 8-22 mins $Therapeutic Activity: 8-22 mins        Daris Harkins, PT, GCS 06/26/19,10:27 AM

## 2019-06-26 NOTE — Anesthesia Postprocedure Evaluation (Signed)
Anesthesia Post Note  Patient: Laura Mckay  Procedure(s) Performed: TOTAL HIP ARTHROPLASTY LEFT (Left Hip)  Patient location during evaluation: Nursing Unit Anesthesia Type: Spinal Level of consciousness: awake and alert and oriented Pain management: satisfactory to patient Vital Signs Assessment: post-procedure vital signs reviewed and stable Respiratory status: respiratory function stable Cardiovascular status: stable Postop Assessment: no backache, no headache, spinal receding, patient able to bend at knees, able to ambulate, adequate PO intake and no apparent nausea or vomiting Anesthetic complications: no     Last Vitals:  Vitals:   06/26/19 0430 06/26/19 0749  BP: 99/60 100/64  Pulse: 85 77  Resp: 16 18  Temp: 37.2 C 37.3 C  SpO2: 95% 97%    Last Pain:  Vitals:   06/26/19 0443  TempSrc:   PainSc: 5                  Blima Singer

## 2019-06-26 NOTE — TOC Progression Note (Signed)
Transition of Care [facility]) - Progression Note    Patient Details  Name: Laura Mckay MRN: [id number] Date of Birth: 1956/06/28  Transition of Care [facility]) CM/SW [place], RN Phone Number: 06/26/2019, 8:35 AM  Clinical Narrative:     Requested the price of Lovenox will notify the patient once obtained       Expected Discharge Plan and Services                                                 Social Determinants of Health (SDOH) Interventions    Readmission Risk Interventions No flowsheet data found.

## 2019-06-26 NOTE — Discharge Summary (Addendum)
Physician Discharge Summary  Patient ID: Laura Mckay Mccullars MRN: [id number] DOB/AGE: 61/07/1956 61 y.o.  Admit date: 06/25/2019 Discharge date: 06/28/19  Admission Diagnoses:  PRIMARY OSTEOARTHRITIS OF LEFT HIP  Discharge Diagnoses: Patient Active Problem List   Diagnosis Date Noted  . Status post total hip replacement, left 06/25/2019   Past Medical History:  Diagnosis Date  . Arthritis    hips  . Complication of anesthesia   . Hx of degenerative disc disease   . Hypertension   . PONV (postoperative nausea and vomiting)    Transfusion: No transfusions during this admission   Consultants (if any): None  Discharged Condition: Improved  Hospital Course: Laura Mckay Roger is an 63 y.o. female who was admitted 06/25/2019 with a diagnosis of degenerative arthrosis left hip and went to the operating room on 06/25/2019 and underwent the above named procedures.    Surgeries:Procedure(s): TOTAL HIP ARTHROPLASTY LEFT on 06/25/2019  PRE-OPERATIVE DIAGNOSIS:  PRIMARY OSTEOARTHRITIS OF LEFT HIP  POST-OPERATIVE DIAGNOSIS:  PRIMARY OSTEOARTHRITIS OF LEFT HIP  PROCEDURE:  Procedure(s): TOTAL HIP ARTHROPLASTY LEFT (Left)  SURGEON: Leitha SchullerMichael J Menz, MD  ASSISTANTS: None  ANESTHESIA:   spinal  EBL:  Total I/O In: 600 [I.V.:600] Out: 200 [Urine:100; Blood:100]  BLOOD ADMINISTERED:none  DRAINS: none   LOCAL MEDICATIONS USED:  MARCAINE    and OTHER Exparel  SPECIMEN:  Source of Specimen:  Left femoral head  DISPOSITION OF SPECIMEN:  PATHOLOGY  COUNTS:  YES  TOURNIQUET:  * No tourniquets in log *  IMPLANTS: Medacta AMIS 2 standard, 52 mm Mpact DM cup and liner with metal S 28 mm head Patient tolerated the surgery well. No complications .Patient was taken to PACU where she was stabilized and then transferred to the orthopedic floor.  Patient started on Lovenox 30 mg q 12 hrs. Foot pumps applied bilaterally at 80 mm hgb. Heels elevated off bed with rolled  towels. No evidence of DVT. Calves non tender. Negative Homan. Physical therapy started on day #1 for gait training and transfer with OT starting on  day #1 for ADL and assisted devices.  Patient's IV And Foley were discontinued on day #1. Prevena woundvac intact on discharge home.  She was given perioperative antibiotics:  Anti-infectives (From admission, onward)   Start     Dose/Rate Route Frequency Ordered Stop   06/25/19 1800  ceFAZolin (ANCEF) IVPB 1 g/50 mL premix     1 g 100 mL/hr over 30 Minutes Intravenous Every 6 hours 06/25/19 1602 06/25/19 2345   06/25/19 1600  ceFAZolin (ANCEF) IVPB 1 g/50 mL premix  Status:  Discontinued     1 g 100 mL/hr over 30 Minutes Intravenous Every 6 hours 06/25/19 1513 06/25/19 1602   06/25/19 1046  ceFAZolin (ANCEF) 2-4 GM/100ML-% IVPB    Note to Pharmacy: Agnes Lawrenceobinson, Patricia  : cabinet override      06/25/19 1046 06/25/19 1213   06/25/19 0000  ceFAZolin (ANCEF) IVPB 2g/100 mL premix     2 g 200 mL/hr over 30 Minutes Intravenous  Once 06/24/19 2353 06/25/19 1228    .  She was fitted with AV 1 compression foot pump devices, instructed on heel pumps, early ambulation, and fitted with TED stockings bilaterally for DVT prophylaxis.  She benefited maximally from the hospital stay and there were no complications.    Recent vital signs:  Vitals:   06/27/19 2314 06/28/19 1023  BP: 116/72 127/69  Pulse: 88 83  Resp: 18 18  Temp: 98.5 F (36.9 C) 98.5  F (36.9 C)  SpO2: 100% 99%    Recent laboratory studies:  Lab Results  Component Value Date   HGB 9.6 (L) 06/28/2019   HGB 10.2 (L) 06/27/2019   HGB 10.2 (L) 06/26/2019   Lab Results  Component Value Date   WBC 10.0 06/28/2019   PLT 242 06/28/2019   Lab Results  Component Value Date   INR 1.0 06/18/2019   Lab Results  Component Value Date   NA 137 06/28/2019   K 3.7 06/28/2019   CL 102 06/28/2019   CO2 28 06/28/2019   BUN 9 06/28/2019   CREATININE 0.65 06/28/2019   GLUCOSE 121  (H) 06/28/2019    Discharge Medications:   Allergies as of 06/28/2019   No Known Allergies     Medication List    STOP taking these medications   ibuprofen 200 MG tablet Commonly known as: ADVIL     TAKE these medications   acetaminophen 500 MG tablet Commonly known as: TYLENOL Take 500 mg by mouth every 6 (six) hours as needed for moderate pain.   bisoprolol-hydrochlorothiazide 5-6.25 MG tablet Commonly known as: ZIAC Take 1 tablet by mouth at bedtime.   desloratadine 5 MG tablet Commonly known as: CLARINEX Take 5 mg by mouth at bedtime.   enoxaparin 40 MG/0.4ML injection Commonly known as: LOVENOX Inject 0.4 mLs (40 mg total) into the skin daily for 14 days.   gabapentin 300 MG capsule Commonly known as: NEURONTIN Take 300 mg by mouth at bedtime.   HYDROcodone-acetaminophen 5-325 MG tablet Commonly known as: NORCO/VICODIN Take 1 tablet by mouth 3 (three) times daily as needed for moderate pain.   nitrofurantoin (macrocrystal-monohydrate) 100 MG capsule Commonly known as: MACROBID Take 1 capsule (100 mg total) by mouth 2 (two) times daily.   ondansetron 4 MG tablet Commonly known as: ZOFRAN Take 1 tablet (4 mg total) by mouth every 6 (six) hours as needed for nausea.   oxyCODONE-acetaminophen 7.5-325 MG tablet Commonly known as: PERCOCET Take 2 tablets by mouth every 4 (four) hours as needed for moderate pain.   PROBIOTIC ADVANCED PO Take 1 tablet by mouth daily.   traMADol 50 MG tablet Commonly known as: ULTRAM Take 1-2 tablets (50-100 mg total) by mouth every 6 (six) hours as needed.            Durable Medical Equipment  (From admission, onward)         Start     Ordered   06/25/19 1514  DME Walker rolling  Once    Question:  Patient needs a walker to treat with the following condition  Answer:  Status post total hip replacement, left   06/25/19 1513   06/25/19 1514  DME 3 n 1  Once     06/25/19 1513   06/25/19 1514  DME Bedside commode   Once    Question:  Patient needs a bedside commode to treat with the following condition  Answer:  Status post total hip replacement, left   06/25/19 1513          Diagnostic Studies: Mm 3d Screen Breast Bilateral  Result Date: 06/07/2019 CLINICAL DATA:  Screening. EXAM: DIGITAL SCREENING BILATERAL MAMMOGRAM WITH TOMO AND CAD COMPARISON:  Previous exam(s). ACR Breast Density Category b: There are scattered areas of fibroglandular density. FINDINGS: There are no findings suspicious for malignancy. Images were processed with CAD. IMPRESSION: No mammographic evidence of malignancy. A result letter of this screening mammogram will be mailed directly to the patient. RECOMMENDATION:  Screening mammogram in one year. (Code:SM-B-01Y) BI-RADS CATEGORY  1: Negative. Electronically Signed   By: Annia Beltrew  Davis M.D.   On: 06/07/2019 12:45   Dg Hip Operative Unilat W Or W/o Pelvis Left  Result Date: 06/25/2019 CLINICAL DATA:  Left hip replacement. FLUOROSCOPY TIME:  18 seconds. Images: 3 EXAM: OPERATIVE LEFT HIP (WITH PELVIS IF PERFORMED) 3 VIEWS TECHNIQUE: Fluoroscopic spot image(s) were submitted for interpretation post-operatively. COMPARISON:  None. FINDINGS: Patient is status post left hip replacement. Within visualize limits, hardware is in good position. The distal femoral component is not included on today's study. IMPRESSION: Left hip replacement as above. Electronically Signed   By: Gerome Samavid  Williams III M.D   On: 06/25/2019 13:38   Dg Hip Unilat W Or W/o Pelvis 2-3 Views Left  Result Date: 06/25/2019 CLINICAL DATA:  Initial evaluation for postoperative pain, recent left hip replacement. EXAM: DG HIP (WITH OR WITHOUT PELVIS) 2-3V LEFT COMPARISON:  None. FINDINGS: Postoperative changes from recent left total hip arthroplasty. Acetabular and femoral components appear well positioned. No periprosthetic fracture or other complication. Postoperative swelling and emphysema seen about the left hip. Skin staples in  place. Visualized pelvis intact. IMPRESSION: Postoperative changes from recent left total hip arthroplasty without complication. Electronically Signed   By: Rise MuBenjamin  McClintock M.D.   On: 06/25/2019 14:43   Disposition: Discharge disposition: 01-Home or Self Care     Plan for discharge home today pending progress with PT.  Discharge Instructions    Increase activity slowly   Complete by: As directed       Follow-up Information    Evon SlackGaines, Thomas C, PA-C Follow up in 14 day(s).   Specialties: Orthopedic Surgery, Emergency Medicine Why: Staple Removal. Contact information: [address] [place] [address] [phone]          Signed: Meriel PicaJames L Tannah Dreyfuss PA-C 06/28/2019, 3:49 PM

## 2019-06-26 NOTE — TOC Initial Note (Signed)
Transition of Care [facility]) - Initial/Assessment Note    Patient Details  Name: Laura Mckay MRN: [id number] Date of Birth: Feb 07, 1956  Transition of [facility]) CM/SW Contact:    Su Hilt, RN Phone Number: 06/26/2019, 12:09 PM  Clinical Narrative:                 Met with the patient to discuss DC plan and needs She lives at home with her husband, has a RW, cane, BSC and shower chair, her husband provides transportation [facility] not able to accept her as a patient based on where she lives I notified [place] [facility] that the patient needs home health, awaiting to hear if they can accept the patient, she sees Dr Doy Hutching as PCP and is up to date with visits, She was provided price of 10$ for Lovenox and she states that she can afford her medication No further needs at this time   Expected Discharge Plan: [place] Barriers to Discharge: Continued Medical Work up   Patient Goals and CMS Choice   CMS Medicare.gov Compare Post Acute Care list provided to:: Patient Choice offered to / list presented to : Patient  Expected Discharge Plan and Services Expected Discharge Plan: [place]   Discharge Planning Services: CM Consult Post Acute Care Choice: [place] arrangements for the past 2 months: Single Family Home                 DME Arranged: N/A         HH Arranged: PT HH Agency: [facility] ([place])     Representative spoke with at [place]: Corene Cornea  Prior Living Arrangements/Services Living arrangements for the past 2 months: [place] Lives with:: Spouse Patient language and need for interpreter reviewed:: No Do you feel safe going back to the place where you live?: Yes      Need for Family Participation in Patient Care: No (Comment) Care giver support system in place?: Yes (comment) Current home services: DME(rw, cane, grabber, BSC) Criminal Activity/Legal Involvement Pertinent to  Current Situation/Hospitalization: No - Comment as needed  Activities of Daily Living Home Assistive Devices/Equipment: Eyeglasses ADL Screening (condition at time of admission) Patient's cognitive ability adequate to safely complete daily activities?: Yes Is the patient deaf or have difficulty hearing?: No Does the patient have difficulty seeing, even when wearing glasses/contacts?: No Does the patient have difficulty concentrating, remembering, or making decisions?: No Patient able to express need for assistance with ADLs?: Yes Does the patient have difficulty dressing or bathing?: No Independently performs ADLs?: Yes (appropriate for developmental age) Does the patient have difficulty walking or climbing stairs?: Yes Weakness of Legs: Left Weakness of Arms/Hands: None  Permission Sought/Granted                  Emotional Assessment Appearance:: Appears stated age Attitude/Demeanor/Rapport: Engaged Affect (typically observed): Accepting Orientation: : Oriented to Self, Oriented to Place, Oriented to  Time, Oriented to Situation Alcohol / Substance Use: Not Applicable Psych Involvement: No (comment)  Admission diagnosis:  PRIMARY OSTEOARTHRITIS OF LEFT HIP Patient Active Problem List   Diagnosis Date Noted  . Status post total hip replacement, left 06/25/2019   PCP:  Idelle Crouch, MD Pharmacy:   [id number] [place] [place] [id number]: [phone]: [phone]    Social Determinants of Health (SDOH) Interventions  Readmission Risk Interventions No flowsheet data found.

## 2019-06-27 LAB — CBC
HCT: 31.1 % — ABNORMAL LOW (ref 36.0–46.0)
Hemoglobin: 10.2 g/dL — ABNORMAL LOW (ref 12.0–15.0)
MCH: 31.9 pg (ref 26.0–34.0)
MCHC: 32.8 g/dL (ref 30.0–36.0)
MCV: 97.2 fL (ref 80.0–100.0)
Platelets: 235 10*3/uL (ref 150–400)
RBC: 3.2 MIL/uL — ABNORMAL LOW (ref 3.87–5.11)
RDW: 13 % (ref 11.5–15.5)
WBC: 9.3 10*3/uL (ref 4.0–10.5)
nRBC: 0 % (ref 0.0–0.2)

## 2019-06-27 LAB — SURGICAL PATHOLOGY

## 2019-06-27 MED ORDER — OXYCODONE-ACETAMINOPHEN 7.5-325 MG PO TABS
2.0000 | ORAL_TABLET | ORAL | Status: DC | PRN
  Administered 2019-06-27 – 2019-06-28 (×4): 2 via ORAL
  Filled 2019-06-27 (×7): qty 2

## 2019-06-27 NOTE — Progress Notes (Signed)
OT Cancellation Note  Patient Details Name: Laura Mckay MRN: [id number] DOB: 1956/06/17   Cancelled Treatment:    Reason Eval/Treat Not Completed: Other (comment). Upon initial attempt to treat, pt just starting to eat breakfast, nurse in administering medications. Pt reporting she felt a bit funny after taking pain medication earlier on an empty stomach. Will return later this am to allow pt more time to eat breakfast prior to OT session.   Jeni Salles, MPH, MS, OTR/L ascom [phone] 06/27/19, 8:43 AM

## 2019-06-27 NOTE — Progress Notes (Signed)
Occupational Therapy Treatment Patient Details Name: Laura Mckay MRN: [id number] DOB: 18-Sep-1956 Today's Date: 06/27/2019    History of present illness Patient is a 63 year old female s/p Left THA. Patient is WBAT. PMH to include OA, HTN, DDD.   OT comments  Pt seen for OT tx this date, focused on review of instruction in AE/DME for ADL, falls prevention strategies, home/routines modifications, and compression stockings mgt with handout provided to support recall and carryover. Pt verbalized understanding and appreciative of review given she felt "loopy" and "out of it" during previous session.    Follow Up Recommendations  No OT follow up    Equipment Recommendations  None recommended by OT    Recommendations for Other Services      Precautions / Restrictions Precautions Precautions: Fall;Anterior Hip Precaution Booklet Issued: Yes (comment) Precaution Comments: pt able to recall 3/3 precautions and how to implement Restrictions Weight Bearing Restrictions: Yes LLE Weight Bearing: Weight bearing as tolerated       Mobility Bed Mobility               General bed mobility comments: deferred, pt requesting to complete breakfast and letting pain medication take affect  Transfers                 General transfer comment: deferred, pt requesting to complete breakfast and letting pain medication take affect    Balance                                           ADL either performed or assessed with clinical judgement   ADL Overall ADL's : Needs assistance/impaired                                       General ADL Comments: Min A for LB ADL, Max A for compression stocking mgt - spouse able to assist     Vision Baseline Vision/History: Wears glasses Wears Glasses: Reading only Patient Visual Report: No change from baseline     Perception     Praxis      Cognition Arousal/Alertness: Awake/alert Behavior During  Therapy: WFL for tasks assessed/performed Overall Cognitive Status: Within Functional Limits for tasks assessed                                          Exercises Other Exercises Other Exercises: pt instructed in review of AE/DME for ADL, falls prevention strategies, home/routines modifications, and compression stockings mgt with handout provided to support recall and carryover   Shoulder Instructions       General Comments      Pertinent Vitals/ Pain       Pain Assessment: 0-10 Pain Score: 2  Pain Location: L hip Pain Descriptors / Indicators: Aching;Burning;Guarding Pain Intervention(s): Limited activity within patient's tolerance;Monitored during session;Premedicated before session;Repositioned  Home Living                                          Prior Functioning/Environment              Frequency  Min 1X/week  Progress Toward Goals  OT Goals(current goals can now be found in the care plan section)  Progress towards OT goals: Progressing toward goals  Acute Rehab OT Goals Patient Stated Goal: to return home OT Goal Formulation: With patient Time For Goal Achievement: 07/10/19 Potential to Achieve Goals: Good  Plan Discharge plan remains appropriate;Frequency remains appropriate    Co-evaluation                 AM-PAC OT "6 Clicks" Daily Activity     Outcome Measure   Help from another person eating meals?: None Help from another person taking care of personal grooming?: None Help from another person toileting, which includes using toliet, bedpan, or urinal?: A Little Help from another person bathing (including washing, rinsing, drying)?: A Little Help from another person to put on and taking off regular upper body clothing?: None Help from another person to put on and taking off regular lower body clothing?: A Little 6 Click Score: 21    End of Session    OT Visit Diagnosis: Other abnormalities of  gait and mobility (R26.89);Pain Pain - Right/Left: Left Pain - part of body: Hip   Activity Tolerance Patient tolerated treatment well   Patient Left in bed;with call bell/phone within reach;with bed alarm set;with SCD's reapplied   Nurse Communication          Time: [id number] OT Time Calculation (min): 12 min  Charges: OT General Charges $OT Visit: 1 Visit OT Treatments $Self Care/Home Management : 8-22 mins  Richrd PrimeJamie Stiller, MPH, MS, OTR/L ascom [phone] 06/27/19, 9:32 AM

## 2019-06-27 NOTE — Progress Notes (Signed)
Physical Therapy Treatment Patient Details Name: Laura Mckay MRN: [id number] DOB: 04-Feb-1956 Today's Date: 06/27/2019    History of Present Illness Patient is a 63 year old female s/p Left THA. Patient is WBAT. PMH to include OA, HTN, DDD.    PT Comments    Patient received pain med 15 min prior to my arrival, lying in bed with cold washcloth over face. Agrees to work with PT. Very painful with supine to sit requiring mod assist. Painful with sit to stand however requires min assist and increased time to accomplish. Patient ambulated 40 feet with RW and min guard/supervision. Very slow pace, step to pattern. Patient will need to perform stair training prior to discharge home. Patient will continue to benefit from skilled PT to improve strength and functional independence.    Follow Up Recommendations  Home health PT     Equipment Recommendations  3in1 (PT)    Recommendations for Other Services       Precautions / Restrictions Precautions Precautions: Fall;Anterior Hip Restrictions Weight Bearing Restrictions: Yes LLE Weight Bearing: Weight bearing as tolerated    Mobility  Bed Mobility Overal bed mobility: Needs Assistance Bed Mobility: Supine to Sit     Supine to sit: Mod assist     General bed mobility comments: Patient having increased pain level this pm, required mod assist to bring B LEs off side of bed and hand held mod assist to raise trunk into seated position.  Transfers Overall transfer level: Needs assistance Equipment used: Rolling walker (2 wheeled) Transfers: Sit to/from Stand Sit to Stand: Min assist            Ambulation/Gait Ambulation/Gait assistance: Supervision;Modified independent (Device/Increase time) Gait Distance (Feet): 40 Feet Assistive device: Rolling walker (2 wheeled) Gait Pattern/deviations: Step-to pattern;Decreased step length - right;Decreased step length - left;Decreased stride length;Decreased weight shift to  left;Shuffle Gait velocity: decreased   General Gait Details: pain with ambulation, slow steady gait, difficulty initiating step on left at first, improved with increased distance.   Stairs             Wheelchair Mobility    Modified Rankin (Stroke Patients Only)       Balance Overall balance assessment: Modified Independent Sitting-balance support: Feet supported;Single extremity supported Sitting balance-Leahy Scale: Good     Standing balance support: Bilateral upper extremity supported Standing balance-Leahy Scale: Good                              Cognition Arousal/Alertness: Awake/alert Behavior During Therapy: WFL for tasks assessed/performed Overall Cognitive Status: Within Functional Limits for tasks assessed                                        Exercises      General Comments        Pertinent Vitals/Pain Pain Assessment: 0-10 Pain Score: 4  Pain Location: L hip Pain Descriptors / Indicators: Aching;Burning;Operative site guarding;Guarding;Grimacing Pain Intervention(s): Limited activity within patient's tolerance;Premedicated before session;Monitored during session;Repositioned;Ice applied    Home Living                      Prior Function            PT Goals (current goals can now be found in the care plan section) Acute Rehab PT Goals Patient Stated Goal:  to return home PT Goal Formulation: With patient Time For Goal Achievement: 06/28/19 Potential to Achieve Goals: Fair Progress towards PT goals: Progressing toward goals    Frequency    BID      PT Plan Current plan remains appropriate    Co-evaluation              AM-PAC PT "6 Clicks" Mobility   Outcome Measure  Help needed turning from your back to your side while in a flat bed without using bedrails?: A Little Help needed moving from lying on your back to sitting on the side of a flat bed without using bedrails?: A  Little Help needed moving to and from a bed to a chair (including a wheelchair)?: A Little Help needed standing up from a chair using your arms (e.g., wheelchair or bedside chair)?: A Little Help needed to walk in hospital room?: A Little Help needed climbing 3-5 steps with a railing? : A Lot 6 Click Score: 17    End of Session Equipment Utilized During Treatment: Gait belt Activity Tolerance: Patient limited by pain Patient left: in chair;with call bell/phone within reach Nurse Communication: Mobility status PT Visit Diagnosis: Muscle weakness (generalized) (M62.81);Difficulty in walking, not elsewhere classified (R26.2);Pain Pain - Right/Left: Left Pain - part of body: Hip     Time: [id number] PT Time Calculation (min) (ACUTE ONLY): 32 min  Charges:  $Gait Training: 8-22 mins $Therapeutic Activity: 8-22 mins                     Laura Mckay, PT, GCS 06/27/19,3:46 PM

## 2019-06-27 NOTE — Progress Notes (Signed)
Physical Therapy Treatment Patient Details Name: Vaudie Engebretsen MRN: [id number] DOB: [birth year] Today's Date: 06/27/2019    History of Present Illness Patient is a 63 year old female s/p Left THA. Patient is WBAT. PMH to include OA, HTN, DDD.    PT Comments    Patient finishing up breakfast and with OT upon arrival. Reports she  Woke up once during the night and got up to use BSC during the night. Reports soreness and burning in left hip with activity. Performed supine bed exercises and ambulated with RW 50' this morning. Improved mobility, requiring decreased assistance this visit. Patient will benefit from continued skilled PT to improve functional independence, and strength.       Follow Up Recommendations  Home health PT     Equipment Recommendations  3in1 (PT)    Recommendations for Other Services       Precautions / Restrictions Precautions Precautions: Anterior Hip;Fall Precaution Booklet Issued: Yes (comment) Precaution Comments: pt able to recall 3/3 precautions and how to implement Restrictions Weight Bearing Restrictions: Yes LLE Weight Bearing: Weight bearing as tolerated    Mobility  Bed Mobility Overal bed mobility: Needs Assistance Bed Mobility: Supine to Sit     Supine to sit: Min guard     General bed mobility comments: deferred, pt requesting to complete breakfast and letting pain medication take affect  Transfers Overall transfer level: Modified independent Equipment used: Rolling walker (2 wheeled) Transfers: Sit to/from Stand Sit to Stand: Modified independent (Device/Increase time)         General transfer comment: deferred, pt requesting to complete breakfast and letting pain medication take affect  Ambulation/Gait Ambulation/Gait assistance: Min guard Gait Distance (Feet): 50 Feet Assistive device: Rolling walker (2 wheeled) Gait Pattern/deviations: Step-to pattern;Decreased step length - right;Decreased step length -  left;Decreased stride length;Shuffle;Decreased weight shift to left Gait velocity: decreased   General Gait Details: pain with ambulation, slow steady gait   Stairs             Wheelchair Mobility    Modified Rankin (Stroke Patients Only)       Balance Overall balance assessment: Modified Independent Sitting-balance support: Feet supported Sitting balance-Leahy Scale: Good     Standing balance support: Bilateral upper extremity supported Standing balance-Leahy Scale: Good                              Cognition Arousal/Alertness: Awake/alert Behavior During Therapy: WFL for tasks assessed/performed Overall Cognitive Status: Within Functional Limits for tasks assessed                                        Exercises Total Joint Exercises Ankle Circles/Pumps: AAROM;10 reps;Both Quad Sets: AROM;10 reps;Both Gluteal Sets: AROM;10 reps;Both Heel Slides: AAROM;Left;10 reps Hip ABduction/ADduction: AAROM;10 reps;Left Other Exercises Other Exercises: pt instructed in review of AE/DME for ADL, falls prevention strategies, home/routines modifications, and compression stockings mgt with handout provided to support recall and carryover    General Comments        Pertinent Vitals/Pain Pain Assessment: Faces Pain Score: 2  Faces Pain Scale: Hurts even more Pain Location: L hip Pain Descriptors / Indicators: Aching;Burning;Discomfort;Operative site guarding;Sore;Grimacing Pain Intervention(s): Limited activity within patient's tolerance;Monitored during session;Repositioned;Ice applied;Premedicated before session    Home Living Family/patient expects to be discharged to:: Private residence Living Arrangements: Spouse/significant other Available Help  at Discharge: Family Type of Home: House Home Access: Stairs to enter Entrance Stairs-Rails: Can reach both;Right;Left Home Layout: One level Home Equipment: Environmental consultantWalker - 2 wheels;Shower seat       Prior Function Level of Independence: Needs assistance  Gait / Transfers Assistance Needed: required assistance from husband for transfers ADL's / Homemaking Assistance Needed: required minimal assistance from spouse for LB ADL 2/2 hip pain Comments: states she just reached for furniture for support, did not use walker prior to admission   PT Goals (current goals can now be found in the care plan section) Acute Rehab PT Goals Patient Stated Goal: to return home PT Goal Formulation: With patient Time For Goal Achievement: 06/28/19 Potential to Achieve Goals: Good Progress towards PT goals: Progressing toward goals    Frequency    BID      PT Plan Current plan remains appropriate    Co-evaluation              AM-PAC PT "6 Clicks" Mobility   Outcome Measure  Help needed turning from your back to your side while in a flat bed without using bedrails?: A Little Help needed moving from lying on your back to sitting on the side of a flat bed without using bedrails?: A Little Help needed moving to and from a bed to a chair (including a wheelchair)?: A Little Help needed standing up from a chair using your arms (e.g., wheelchair or bedside chair)?: A Little Help needed to walk in hospital room?: A Little Help needed climbing 3-5 steps with a railing? : A Lot 6 Click Score: 17    End of Session Equipment Utilized During Treatment: Gait belt Activity Tolerance: Patient limited by pain;Patient tolerated treatment well Patient left: in chair;with call bell/phone within reach Nurse Communication: Mobility status PT Visit Diagnosis: Muscle weakness (generalized) (M62.81);Difficulty in walking, not elsewhere classified (R26.2);Pain Pain - Right/Left: Left Pain - part of body: Hip     Time: 0915-0955 PT Time Calculation (min) (ACUTE ONLY): 40 min  Charges:  $Gait Training: 8-22 mins $Therapeutic Exercise: 8-22 mins                     Camila [place]ville, PT,  GCS 06/27/19,10:04 AM

## 2019-06-27 NOTE — Progress Notes (Signed)
Subjective: 2 Days Post-Op Procedure(s) (LRB): TOTAL HIP ARTHROPLASTY LEFT (Left) Patient reports pain as mild this morning. Did have severe pain last night. Patient is well and reports that her pain is much improved, on Norco. Continue with PT today. Plan is to go Home after hospital stay. no nausea and no vomiting Patient denies any chest pains or shortness of breath. Fever of 101 last night, WBC 9.3.  Encouraged incentive spirometer.  Objective: Vital signs in last 24 hours: Temp:  [97.5 F (36.4 C)-101.1 F (38.4 C)] 98.4 F (36.9 C) (07/02 0630) Pulse Rate:  [77-105] 100 (07/02 0011) Resp:  [15-18] 15 (07/02 0011) BP: (100-154)/(64-81) 142/68 (07/02 0011) SpO2:  [97 %-100 %] 98 % (07/02 0011) Wound VAC in place Heels are non tender and elevated off the bed using rolled towels Some diffuse erythematous tissue to the medial upper thigh  Intake/Output from previous day: 07/01 0701 - 07/02 0700 In: 847.6 [P.O.:360; I.V.:487.6] Out: 500 [Urine:500] Intake/Output this shift: No intake/output data recorded.  Recent Labs    06/25/19 1626 06/26/19 0458 06/27/19 0317  HGB 12.3 10.2* 10.2*   Recent Labs    06/26/19 0458 06/27/19 0317  WBC 6.4 9.3  RBC 3.24* 3.20*  HCT 30.5* 31.1*  PLT 249 235   Recent Labs    06/25/19 1626 06/26/19 0458  NA  --  138  K  --  3.5  CL  --  109  CO2  --  23  BUN  --  13  CREATININE 0.76 0.69  GLUCOSE  --  141*  CALCIUM  --  8.1*   No results for input(s): LABPT, INR in the last 72 hours.  EXAM General - Patient is Alert, Appropriate and Oriented Extremity - Neurologically intact Neurovascular intact Sensation intact distally Intact pulses distally Dorsiflexion/Plantar flexion intact Compartment soft Dressing - Wound VAC in place Motor Function - intact, moving foot and toes well on exam.  Able to do straight leg raise on  Past Medical History:  Diagnosis Date  . Arthritis    hips  . Complication of anesthesia   .  Hx of degenerative disc disease   . Hypertension   . PONV (postoperative nausea and vomiting)    Assessment/Plan: 2 Days Post-Op Procedure(s) (LRB): TOTAL HIP ARTHROPLASTY LEFT (Left) Active Problems:   Status post total hip replacement, left  Estimated body mass index is 25.9 kg/m as calculated from the following:   Height as of this encounter: 5\' 5"  (1.651 m).   Weight as of this encounter: 70.6 kg. Advance diet Up with therapy D/C IV fluids Plan for discharge tomorrow Discharge home with home health  Labs: Hg 10.2 this morning. Continue to work on BM. Up with therapy today.  Plan for possible d/c home tomorrow. CBC and BMP ordered for tomorrow morning.  DVT Prophylaxis - Lovenox, Foot Pumps and TED hose  J. Cameron Proud, PA-C [place] 06/27/2019, 7:25 AM

## 2019-06-28 LAB — CBC
HCT: 29.8 % — ABNORMAL LOW (ref 36.0–46.0)
Hemoglobin: 9.6 g/dL — ABNORMAL LOW (ref 12.0–15.0)
MCH: 31.4 pg (ref 26.0–34.0)
MCHC: 32.2 g/dL (ref 30.0–36.0)
MCV: 97.4 fL (ref 80.0–100.0)
Platelets: 242 10*3/uL (ref 150–400)
RBC: 3.06 MIL/uL — ABNORMAL LOW (ref 3.87–5.11)
RDW: 13.1 % (ref 11.5–15.5)
WBC: 10 10*3/uL (ref 4.0–10.5)
nRBC: 0 % (ref 0.0–0.2)

## 2019-06-28 LAB — BASIC METABOLIC PANEL
Anion gap: 7 (ref 5–15)
BUN: 9 mg/dL (ref 8–23)
CO2: 28 mmol/L (ref 22–32)
Calcium: 8.6 mg/dL — ABNORMAL LOW (ref 8.9–10.3)
Chloride: 102 mmol/L (ref 98–111)
Creatinine, Ser: 0.65 mg/dL (ref 0.44–1.00)
GFR calc Af Amer: >60 mL/min (ref >60)
GFR calc non Af Amer: >60 mL/min (ref >60)
Glucose, Bld: 121 mg/dL — ABNORMAL HIGH (ref 70–99)
Potassium: 3.7 mmol/L (ref 3.5–5.1)
Sodium: 137 mmol/L (ref 135–145)

## 2019-06-28 MED ORDER — ONDANSETRON HCL 4 MG PO TABS: 4.0000 mg | ORAL_TABLET | Freq: Four times a day (QID) | ORAL | 0 refills | Status: DC | PRN

## 2019-06-28 MED ORDER — OXYCODONE-ACETAMINOPHEN 7.5-325 MG PO TABS: 2.0000 | ORAL_TABLET | ORAL | 0 refills | Status: DC | PRN

## 2019-06-28 MED ORDER — TRAMADOL HCL 50 MG PO TABS: 50.0000 mg | ORAL_TABLET | Freq: Four times a day (QID) | ORAL | 0 refills | Status: DC | PRN

## 2019-06-28 MED ORDER — ENOXAPARIN SODIUM 40 MG/0.4ML ~~LOC~~ SOLN: 40.0000 mg | SUBCUTANEOUS | 0 refills | Status: DC

## 2019-06-28 NOTE — Progress Notes (Signed)
Physical Therapy Treatment Patient Details Name: Laura LauberDeborah Lewis Mckay MRN: [id number] DOB: 06/02/1956 Today's Date: 06/28/2019    History of Present Illness Patient is a 63 year old female s/p Left THA. Patient is WBAT. PMH to include OA, HTN, DDD.    PT Comments    Patient reports her stomach is bloated and she is having a bit of nausea. Agreeable to walk some. Patient demonstrates improved mobility with transfers.  Patient was able to improve gait distance to 125 feet with RW. Supervision only. Patient will benefit from skilled PT to improve her strength, ambulation and activity tolerance.      Follow Up Recommendations  Home health PT     Equipment Recommendations  3in1 (PT)    Recommendations for Other Services       Precautions / Restrictions Precautions Precautions: Anterior Hip;Fall Restrictions Weight Bearing Restrictions: Yes LLE Weight Bearing: Weight bearing as tolerated    Mobility  Bed Mobility                  Transfers Overall transfer level: Modified independent Equipment used: Rolling walker (2 wheeled) Transfers: Sit to/from Stand Sit to Stand: Supervision            Ambulation/Gait Ambulation/Gait assistance: Modified independent (Device/Increase time) Gait Distance (Feet): 125 Feet Assistive device: Rolling walker (2 wheeled) Gait Pattern/deviations: Step-to pattern;Decreased step length - right;Decreased step length - left;Decreased stride length;Decreased weight shift to left;Shuffle Gait velocity: decreased   General Gait Details: pain with ambulation, slow steady gait, difficulty initiating step on left at first, improved with increased distance.   Stairs             Wheelchair Mobility    Modified Rankin (Stroke Patients Only)       Balance Overall balance assessment: Modified Independent Sitting-balance support: Feet supported Sitting balance-Leahy Scale: Normal     Standing balance support: Bilateral upper  extremity supported Standing balance-Leahy Scale: Good                              Cognition   Behavior During Therapy: WFL for tasks assessed/performed Overall Cognitive Status: Within Functional Limits for tasks assessed                                        Exercises Other Exercises Other Exercises: we reviewed LE HEP, packet given to patient to take home.    General Comments        Pertinent Vitals/Pain Pain Assessment: Faces Faces Pain Scale: Hurts even more Pain Location: L hip Pain Descriptors / Indicators: Burning;Operative site guarding Pain Intervention(s): Limited activity within patient's tolerance;Repositioned;Monitored during session;Ice applied;Relaxation;Premedicated before session    Home Living                      Prior Function            PT Goals (current goals can now be found in the care plan section) Acute Rehab PT Goals Patient Stated Goal: to return home PT Goal Formulation: With patient Time For Goal Achievement: 06/28/19 Potential to Achieve Goals: Good Progress towards PT goals: Progressing toward goals    Frequency    BID      PT Plan Current plan remains appropriate    Co-evaluation  AM-PAC PT "6 Clicks" Mobility   Outcome Measure  Help needed turning from your back to your side while in a flat bed without using bedrails?: A Little Help needed moving from lying on your back to sitting on the side of a flat bed without using bedrails?: A Little Help needed moving to and from a bed to a chair (including a wheelchair)?: A Little Help needed standing up from a chair using your arms (e.g., wheelchair or bedside chair)?: A Little Help needed to walk in hospital room?: A Little Help needed climbing 3-5 steps with a railing? : A Little 6 Click Score: 18    End of Session Equipment Utilized During Treatment: Gait belt Activity Tolerance: Patient limited by pain Patient  left: in chair;with call bell/phone within reach Nurse Communication: Mobility status PT Visit Diagnosis: Muscle weakness (generalized) (M62.81);Difficulty in walking, not elsewhere classified (R26.2);Pain Pain - Right/Left: Left Pain - part of body: Hip     Time: 1450-1520 PT Time Calculation (min) (ACUTE ONLY): 30 min  Charges:  $Gait Training: 23-37 mins                     Makaelyn Aponte, PT, GCS 06/28/19,4:03 PM

## 2019-06-28 NOTE — Progress Notes (Signed)
Pt is passing gas. Denies abdominal pain. Per pt she has BM on a normal schedule of every 4 days when home. Wishes to go home and have BM. PA paged and pt will go home. Husband to come to bedside to have lovenox teaching.

## 2019-06-28 NOTE — Discharge Instructions (Signed)
ANTERIOR APPROACH TOTAL HIP REPLACEMENT POSTOPERATIVE DIRECTIONS   Hip Rehabilitation, Guidelines Following Surgery  The results of a hip operation are greatly improved after range of motion and muscle strengthening exercises. Follow all safety measures which are given to protect your hip. If any of these exercises cause increased pain or swelling in your joint, decrease the amount until you are comfortable again. Then slowly increase the exercises. Call your caregiver if you have problems or questions.   HOME CARE INSTRUCTIONS  Remove items at home which could result in a fall. This includes throw rugs or furniture in walking pathways.   ICE to the affected hip every three hours for 30 minutes at a time and then as needed for pain and swelling.  Continue to use ice on the hip for pain and swelling from surgery. You may notice swelling that will progress down to the foot and ankle.  This is normal after surgery.  Elevate the leg when you are not up walking on it.    Continue to use the breathing machine which will help keep your temperature down.  It is common for your temperature to cycle up and down following surgery, especially at night when you are not up moving around and exerting yourself.  The breathing machine keeps your lungs expanded and your temperature down.  Do not place pillow under knee, focus on keeping the knee straight while resting  DIET You may resume your previous home diet once your are discharged from the hospital.  DRESSING / WOUND CARE / SHOWERING Keep Prevena intact for 7 days following discharge and then change to normal dry dressing. Keep your dressing dry with showering.  You can keep it covered and pat dry. Change the surgical dressing daily and reapply a dry dressing each time.  ACTIVITY Walk with your walker as instructed. Use walker as long as suggested by your caregivers. Avoid periods of inactivity such as sitting longer than an hour when not asleep. This  helps prevent blood clots.  You may resume a sexual relationship in one month or when given the OK by your doctor.  You may return to work once you are cleared by your doctor.  Do not drive a car for 6 weeks or until released by you surgeon.  Do not drive while taking narcotics.  WEIGHT BEARING Weight bearing as tolerated with assist device (walker, cane, etc) as directed, use it as long as suggested by your surgeon or therapist, typically at least 4-6 weeks.  POSTOPERATIVE CONSTIPATION PROTOCOL Constipation - defined medically as fewer than three stools per week and severe constipation as less than one stool per week.  One of the most common issues patients have following surgery is constipation.  Even if you have a regular bowel pattern at home, your normal regimen is likely to be disrupted due to multiple reasons following surgery.  Combination of anesthesia, postoperative narcotics, change in appetite and fluid intake all can affect your bowels.  In order to avoid complications following surgery, here are some recommendations in order to help you during your recovery period.  Colace (docusate) - Pick up an over-the-counter form of Colace or another stool softener and take twice a day as long as you are requiring postoperative pain medications.  Take with a full glass of water daily.  If you experience loose stools or diarrhea, hold the colace until you stool forms back up.  If your symptoms do not get better within 1 week or if they get worse,  check with your doctor.  Dulcolax (bisacodyl) - Pick up over-the-counter and take as directed by the product packaging as needed to assist with the movement of your bowels.  Take with a full glass of water.  Use this product as needed if not relieved by Colace only.   MiraLax (polyethylene glycol) - Pick up over-the-counter to have on hand.  MiraLax is a solution that will increase the amount of water in your bowels to assist with bowel movements.  Take  as directed and can mix with a glass of water, juice, soda, coffee, or tea.  Take if you go more than two days without a movement. Do not use MiraLax more than once per day. Call your doctor if you are still constipated or irregular after using this medication for 7 days in a row.  If you continue to have problems with postoperative constipation, please contact the office for further assistance and recommendations.  If you experience "the worst abdominal pain ever" or develop nausea or vomiting, please contact the office immediatly for further recommendations for treatment.  ITCHING  If you experience itching with your medications, try taking only a single pain pill, or even half a pain pill at a time.  You can also use Benadryl over the counter for itching or also to help with sleep.   TED HOSE STOCKINGS Wear the elastic stockings on both legs for three weeks following surgery during the day but you may remove then at night for sleeping.  MEDICATIONS See your medication summary on the After Visit Summary that the nursing staff will review with you prior to discharge.  You may have some home medications which will be placed on hold until you complete the course of blood thinner medication.  It is important for you to complete the blood thinner medication as prescribed by your surgeon.  Continue your approved medications as instructed at time of discharge.  PRECAUTIONS If you experience chest pain or shortness of breath - call 911 immediately for transfer to the hospital emergency department.  If you develop a fever greater that 101 F, purulent drainage from wound, increased redness or drainage from wound, foul odor from the wound/dressing, or calf pain - CONTACT YOUR SURGEON.                                                   FOLLOW-UP APPOINTMENTS Make sure you keep all of your appointments after your operation with your surgeon and caregivers. You should call the office at the above phone  number and make an appointment for approximately two weeks after the date of your surgery or on the date instructed by your surgeon outlined in the "After Visit Summary".  RANGE OF MOTION AND STRENGTHENING EXERCISES  These exercises are designed to help you keep full movement of your hip joint. Follow your caregiver's or physical therapist's instructions. Perform all exercises about fifteen times, three times per day or as directed. Exercise both hips, even if you have had only one joint replacement. These exercises can be done on a training (exercise) mat, on the floor, on a table or on a bed. Use whatever works the best and is most comfortable for you. Use music or television while you are exercising so that the exercises are a pleasant break in your day. This will make your life better with  the exercises acting as a break in routine you can look forward to.  Lying on your back, slowly slide your foot toward your buttocks, raising your knee up off the floor. Then slowly slide your foot back down until your leg is straight again.  Lying on your back spread your legs as far apart as you can without causing discomfort.  Lying on your side, raise your upper leg and foot straight up from the floor as far as is comfortable. Slowly lower the leg and repeat.  Lying on your back, tighten up the muscle in the front of your thigh (quadriceps muscles). You can do this by keeping your leg straight and trying to raise your heel off the floor. This helps strengthen the largest muscle supporting your knee.  Lying on your back, tighten up the muscles of your buttocks both with the legs straight and with the knee bent at a comfortable angle while keeping your heel on the floor.   IF YOU ARE TRANSFERRED TO A SKILLED REHAB FACILITY If the patient is transferred to a skilled rehab facility following release from the hospital, a list of the current medications will be sent to the facility for the patient to continue.  When  discharged from the skilled rehab facility, please have the facility set up the patient's [place] prior to being released. Also, the skilled facility will be responsible for providing the patient with their medications at time of release from the facility to include their pain medication, the muscle relaxants, and their blood thinner medication. If the patient is still at the rehab facility at time of the two week follow up appointment, the skilled rehab facility will also need to assist the patient in arranging follow up appointment in our office and any transportation needs.  MAKE SURE YOU:  Understand these instructions.  Get help right away if you are not doing well or get worse.    Pick up stool softner and laxative for home use following surgery while on pain medications. Do not submerge incision under water. Please use good hand washing techniques while changing dressing each day. May shower starting three days after surgery. Please use a clean towel to pat the incision dry following showers. Continue to use ice for pain and swelling after surgery. Do not use any lotions or creams on the incision until instructed by your surgeon.

## 2019-06-28 NOTE — Progress Notes (Signed)
Physical Therapy Treatment Patient Details Name: Laura LauberDeborah Lewis Mckay MRN: [id number] DOB: 10/25/1956 Today's Date: 06/28/2019    History of Present Illness Patient is a 63 year old female s/p Left THA. Patient is WBAT. PMH to include OA, HTN, DDD.    PT Comments    Patient received in bed, received pain medicine and is ready for therapy. Planning to go home today. Patient continues to be limited by pain and with mobility then begins to have nausea, feel faint etc. Therefore mobility is very slow. Patient ambulated with RW 50 feet with supervision and ambulated up/down 4 steps with B rails and supervision. Patient had to sit down for several minutes after stair training due to nausea. Patient will benefit from continued skilled PT to address her decreased functional independence and pain.        Follow Up Recommendations  Home health PT     Equipment Recommendations  3in1 (PT)    Recommendations for Other Services       Precautions / Restrictions Precautions Precautions: Anterior Hip;Fall Restrictions Weight Bearing Restrictions: Yes LLE Weight Bearing: Weight bearing as tolerated    Mobility  Bed Mobility Overal bed mobility: Needs Assistance Bed Mobility: Supine to Sit     Supine to sit: Min assist     General bed mobility comments: assistance with bringing LEs off bed, assist to sit trunk up  Transfers Overall transfer level: Modified independent Equipment used: Rolling walker (2 wheeled) Transfers: Sit to/from Stand Sit to Stand: Min guard            Ambulation/Gait Ambulation/Gait assistance: Supervision Gait Distance (Feet): 50 Feet Assistive device: Rolling walker (2 wheeled) Gait Pattern/deviations: Step-to pattern;Decreased step length - right;Decreased step length - left;Decreased stride length Gait velocity: decreased   General Gait Details: pain with ambulation, slow steady gait, difficulty initiating step on left at first, improved with increased  distance.   Stairs Stairs: Yes Stairs assistance: Supervision Stair Management: Two rails Number of Stairs: 4     Wheelchair Mobility    Modified Rankin (Stroke Patients Only)       Balance Overall balance assessment: Modified Independent Sitting-balance support: Feet supported Sitting balance-Leahy Scale: Good     Standing balance support: Bilateral upper extremity supported Standing balance-Leahy Scale: Good                              Cognition Arousal/Alertness: Awake/alert Behavior During Therapy: WFL for tasks assessed/performed Overall Cognitive Status: Within Functional Limits for tasks assessed                                        Exercises      General Comments        Pertinent Vitals/Pain Pain Assessment: Faces Faces Pain Scale: Hurts whole lot Pain Location: L hip Pain Descriptors / Indicators: Aching;Burning;Guarding;Grimacing;Moaning;Operative site guarding Pain Intervention(s): Limited activity within patient's tolerance;Monitored during session;Repositioned;Ice applied;Premedicated before session    Home Living                      Prior Function            PT Goals (current goals can now be found in the care plan section) Acute Rehab PT Goals Patient Stated Goal: to return home PT Goal Formulation: With patient Time For Goal Achievement: 06/28/19 Potential to  Achieve Goals: Good Progress towards PT goals: Progressing toward goals    Frequency    BID      PT Plan Current plan remains appropriate    Co-evaluation              AM-PAC PT "6 Clicks" Mobility   Outcome Measure  Help needed turning from your back to your side while in a flat bed without using bedrails?: A Little Help needed moving from lying on your back to sitting on the side of a flat bed without using bedrails?: A Little Help needed moving to and from a bed to a chair (including a wheelchair)?: A Little Help  needed standing up from a chair using your arms (e.g., wheelchair or bedside chair)?: A Little Help needed to walk in hospital room?: A Little Help needed climbing 3-5 steps with a railing? : A Little 6 Click Score: 18    End of Session Equipment Utilized During Treatment: Gait belt Activity Tolerance: Patient limited by pain Patient left: in chair;with call bell/phone within reach Nurse Communication: Mobility status PT Visit Diagnosis: Muscle weakness (generalized) (M62.81);Difficulty in walking, not elsewhere classified (R26.2);Pain Pain - Right/Left: Left Pain - part of body: Hip     Time: 0917-1010 PT Time Calculation (min) (ACUTE ONLY): 53 min  Charges:  $Gait Training: 23-37 mins $Therapeutic Activity: 23-37 mins                     Fernand Sorbello, PT, GCS 06/28/19,10:19 AM

## 2019-06-28 NOTE — Progress Notes (Signed)
Subjective: 3 Days Post-Op Procedure(s) (LRB): TOTAL HIP ARTHROPLASTY LEFT (Left) Patient reports pain as mild this morning. Did have severe pain last night. Patient is well and reports that her pain is much improved, switched back to oxycodone yesterday. Continue with PT today.  Plan for d/c home today. no nausea and no vomiting.  Patient is passing gas without pain. Patient denies any chest pains or shortness of breath. No recent fevers.  Objective: Vital signs in last 24 hours: Temp:  [98.5 F (36.9 C)-99.2 F (37.3 C)] 98.5 F (36.9 C) (07/02 2314) Pulse Rate:  [88-103] 88 (07/02 2314) Resp:  [16-18] 18 (07/02 2314) BP: (111-144)/(66-88) 116/72 (07/02 2314) SpO2:  [98 %-100 %] 100 % (07/02 2314) Wound VAC in place Heels are non tender and elevated off the bed using rolled towels  Intake/Output from previous day: 07/02 0701 - 07/03 0700 In: 760 [P.O.:760] Out: -  Intake/Output this shift: No intake/output data recorded.  Recent Labs    06/25/19 1626 06/26/19 0458 06/27/19 0317 06/28/19 0321  HGB 12.3 10.2* 10.2* 9.6*   Recent Labs    06/27/19 0317 06/28/19 0321  WBC 9.3 10.0  RBC 3.20* 3.06*  HCT 31.1* 29.8*  PLT 235 242   Recent Labs    06/26/19 0458 06/28/19 0321  NA 138 137  K 3.5 3.7  CL 109 102  CO2 23 28  BUN 13 9  CREATININE 0.69 0.65  GLUCOSE 141* 121*  CALCIUM 8.1* 8.6*   No results for input(s): LABPT, INR in the last 72 hours.  EXAM General - Patient is Alert, Appropriate and Oriented Extremity - Neurologically intact Neurovascular intact Sensation intact distally Intact pulses distally Dorsiflexion/Plantar flexion intact Compartment soft Dressing - Wound VAC in place Motor Function - intact, moving foot and toes well on exam.  Able to do straight leg raise on command.  Past Medical History:  Diagnosis Date  . Arthritis    hips  . Complication of anesthesia   . Hx of degenerative disc disease   . Hypertension   . PONV  (postoperative nausea and vomiting)    Assessment/Plan: 3 Days Post-Op Procedure(s) (LRB): TOTAL HIP ARTHROPLASTY LEFT (Left) Active Problems:   Status post total hip replacement, left  Estimated body mass index is 25.9 kg/m as calculated from the following:   Height as of this encounter: 5\' 5"  (1.651 m).   Weight as of this encounter: 70.6 kg. Advance diet Up with therapy D/C IV fluids Plan for discharge tomorrow Discharge home with home health  Labs: Hg 9.6 this morning.  WBC 10.0 Continue to work on BM.  Patient is passing gas without pain. Up with therapy today.  Plan for discharge home this afternoon after working on stairs.  DVT Prophylaxis - Lovenox, Foot Pumps and TED hose  J. Cameron Proud, PA-C [place] 06/28/2019, 8:56 AM

## 2019-06-28 NOTE — Progress Notes (Signed)
Patient complaints of pain. PRN medication not loaded in pysis. Pharmacy staff notified.

## 2019-06-28 NOTE — Progress Notes (Signed)
Pt. Discharged to home via husbands vehicle. Discharge instructions and medication regimen reviewed at bedside with patient. Pt. verbalizes understanding of instructions and medication regimen. Prescriptions with pt. Patient assessment unchanged from this morning. IV discontinued per policy. Husband has had lovenox teaching

## 2019-08-06 ENCOUNTER — Telehealth: Payer: Self-pay | Admitting: Obstetrics and Gynecology

## 2019-08-06 NOTE — Telephone Encounter (Signed)
Spoke with patient and let her know that dr. Amalia Hailey sent in a refill with the last prescription. She will check with her pharmacy.

## 2019-08-06 NOTE — Telephone Encounter (Signed)
Patient called stating she has uti symptoms and would like something sent to her pharmacy if possible.She complains of odor, frequency,cloudy urine and fatigue. She would like a call back as soon as possible. Thanks

## 2019-09-18 ENCOUNTER — Encounter
Admission: RE | Admit: 2019-09-18 | Discharge: 2019-09-18 | Disposition: A | Discharge status: Home / Self Care | Payer: No Typology Code available for payment source | Source: Ambulatory Visit | Attending: Orthopedic Surgery | Admitting: Orthopedic Surgery

## 2019-09-18 ENCOUNTER — Other Ambulatory Visit: Payer: Self-pay

## 2019-09-18 DIAGNOSIS — M25551 Pain in right hip: Secondary | ICD-10-CM | POA: Insufficient documentation

## 2019-09-18 DIAGNOSIS — Z01812 Encounter for preprocedural laboratory examination: Secondary | ICD-10-CM | POA: Diagnosis not present

## 2019-09-18 DIAGNOSIS — Z0184 Encounter for antibody response examination: Secondary | ICD-10-CM | POA: Insufficient documentation

## 2019-09-18 LAB — URINALYSIS, ROUTINE W REFLEX MICROSCOPIC
Bilirubin Urine: NEGATIVE
Glucose, UA: NEGATIVE mg/dL
Hgb urine dipstick: NEGATIVE
Ketones, ur: NEGATIVE mg/dL
Leukocytes,Ua: NEGATIVE
Nitrite: NEGATIVE
Protein, ur: NEGATIVE mg/dL
Specific Gravity, Urine: 1.023 (ref 1.005–1.030)
pH: 5 (ref 5.0–8.0)

## 2019-09-18 LAB — APTT: aPTT: 27 seconds (ref 24–36)

## 2019-09-18 LAB — PROTIME-INR
INR: 1 (ref 0.8–1.2)
Prothrombin Time: 12.9 seconds (ref 11.4–15.2)

## 2019-09-18 LAB — TYPE AND SCREEN
ABO/RH(D): O POS
Antibody Screen: NEGATIVE

## 2019-09-18 LAB — BASIC METABOLIC PANEL
Anion gap: 10 (ref 5–15)
BUN: 21 mg/dL (ref 8–23)
CO2: 25 mmol/L (ref 22–32)
Calcium: 9.6 mg/dL (ref 8.9–10.3)
Chloride: 101 mmol/L (ref 98–111)
Creatinine, Ser: 0.86 mg/dL (ref 0.44–1.00)
GFR calc Af Amer: >60 mL/min (ref >60)
GFR calc non Af Amer: >60 mL/min (ref >60)
Glucose, Bld: 97 mg/dL (ref 70–99)
Potassium: 3.7 mmol/L (ref 3.5–5.1)
Sodium: 136 mmol/L (ref 135–145)

## 2019-09-18 LAB — CBC
HCT: 42.1 % (ref 36.0–46.0)
Hemoglobin: 13.7 g/dL (ref 12.0–15.0)
MCH: 30.3 pg (ref 26.0–34.0)
MCHC: 32.5 g/dL (ref 30.0–36.0)
MCV: 93.1 fL (ref 80.0–100.0)
Platelets: 349 10*3/uL (ref 150–400)
RBC: 4.52 MIL/uL (ref 3.87–5.11)
RDW: 13.4 % (ref 11.5–15.5)
WBC: 6.3 10*3/uL (ref 4.0–10.5)
nRBC: 0 % (ref 0.0–0.2)

## 2019-09-18 LAB — SURGICAL PCR SCREEN
MRSA, PCR: NEGATIVE
Staphylococcus aureus: POSITIVE — AB

## 2019-09-18 LAB — SEDIMENTATION RATE: Sed Rate: 6 mm/hr (ref 0–30)

## 2019-09-18 NOTE — Patient Instructions (Signed)
Your procedure is scheduled on: September 26, 2019 THURSDAY Report to Day Surgery on the 2nd floor of the [facility]. To find out your arrival time, please call [phone] between 1PM - 3PM on: September 25, 2019  REMEMBER: Instructions that are not followed completely may result in serious medical risk, up to and including death; or upon the discretion of your surgeon and anesthesiologist your surgery may need to be rescheduled.  Do not eat food after midnight the night before surgery.  No gum chewing, lozengers or hard candies.  You may however, drink CLEAR liquids up to 2 hours before you are scheduled to arrive for your surgery. Do not drink anything within 2 hours of the start of your surgery.  Clear liquids include: - water  - apple juice without pulp - CLEAR gatorade - black coffee or tea (Do NOT add milk or creamers to the coffee or tea) Do NOT drink anything that is not on this list.  Type 1 and Type 2 diabetics should only drink water.  No Alcohol for 24 hours before or after surgery.  No Smoking including e-cigarettes for 24 hours prior to surgery.  No chewable tobacco products for at least 6 hours prior to surgery.  No nicotine patches on the day of surgery.  On the morning of surgery brush your teeth with toothpaste and water, you may rinse your mouth with mouthwash if you wish. Do not swallow any toothpaste or mouthwash.  Notify your doctor if there is any change in your medical condition (cold, fever, infection).  Do not wear jewelry, make-up, hairpins, clips or nail polish.  Do not wear lotions, powders, or perfumes.   Do not shave 48 hours prior to surgery.   Contacts and dentures may not be worn into surgery.  Do not bring valuables to the hospital, including drivers license, insurance or credit cards.  [facility] is not responsible for any belongings or valuables.   TAKE THESE MEDICATIONS THE MORNING OF SURGERY: NONE   Use CHG Soap as directed on  instruction sheet.   Follow recommendations from Cardiologist, Pulmonologist or PCP regarding stopping Aspirin, Coumadin, Plavix, Eliquis, Pradaxa, or Pletal.  Stop Anti-inflammatories (NSAIDS) such as Advil, Aleve, Ibuprofen, Motrin, Naproxen, Naprosyn and Aspirin based products such as Excedrin, Goodys Powder, BC Powder. (May take Tylenol or Acetaminophen if needed.)  Stop ANY OVER THE COUNTER supplements until after surgery. (May continue Vitamin D, Vitamin B, and multivitamin.)  Wear comfortable clothing (specific to your surgery type) to the hospital.  Plan for stool softeners for home use.  If you are being admitted to the hospital overnight, leave your suitcase in the car. After surgery it may be brought to your room.  If you are being discharged the day of surgery, you will not be allowed to drive home. You will need a responsible adult to drive you home and stay with you that night.   If you are taking public transportation, you will need to have a responsible adult with you. Please confirm with your physician that it is acceptable to use public transportation.   Please call [phone] if you have any questions about these instructions.

## 2019-09-18 NOTE — Pre-Procedure Instructions (Signed)
Abnormal PCR screen result faxed to Dr Rudene Christians office. Staph positive.

## 2019-09-20 LAB — URINE CULTURE: Culture: 40000 — AB

## 2019-09-23 ENCOUNTER — Other Ambulatory Visit: Payer: Self-pay

## 2019-09-23 ENCOUNTER — Other Ambulatory Visit
Admission: RE | Admit: 2019-09-23 | Discharge: 2019-09-23 | Disposition: A | Discharge status: Home / Self Care | Payer: No Typology Code available for payment source | Source: Ambulatory Visit | Attending: Orthopedic Surgery | Admitting: Orthopedic Surgery

## 2019-09-23 DIAGNOSIS — Z01812 Encounter for preprocedural laboratory examination: Secondary | ICD-10-CM | POA: Insufficient documentation

## 2019-09-23 DIAGNOSIS — M1611 Unilateral primary osteoarthritis, right hip: Secondary | ICD-10-CM | POA: Insufficient documentation

## 2019-09-23 DIAGNOSIS — Z20828 Contact with and (suspected) exposure to other viral communicable diseases: Secondary | ICD-10-CM | POA: Insufficient documentation

## 2019-09-23 LAB — SARS CORONAVIRUS 2 (TAT 6-24 HRS): SARS Coronavirus 2: NEGATIVE

## 2019-09-25 MED ORDER — TRANEXAMIC ACID-NACL 1000-0.7 MG/100ML-% IV SOLN
1000.0000 mg | INTRAVENOUS | Status: AC
  Administered 2019-09-26: 1000 mg via INTRAVENOUS
  Filled 2019-09-25: qty 100

## 2019-09-26 ENCOUNTER — Inpatient Hospital Stay: Payer: No Typology Code available for payment source

## 2019-09-26 ENCOUNTER — Inpatient Hospital Stay
Admission: RE | Admit: 2019-09-26 | Discharge: 2019-09-29 | DRG: 470 | Disposition: A | Discharge status: Home Health | Payer: No Typology Code available for payment source | Attending: Orthopedic Surgery | Admitting: Orthopedic Surgery

## 2019-09-26 ENCOUNTER — Encounter: Payer: Self-pay | Admitting: *Deleted

## 2019-09-26 ENCOUNTER — Other Ambulatory Visit: Payer: Self-pay

## 2019-09-26 ENCOUNTER — Inpatient Hospital Stay: Payer: No Typology Code available for payment source | Admitting: Anesthesiology

## 2019-09-26 ENCOUNTER — Encounter
Admission: RE | Disposition: A | Discharge status: Home / Self Care | Payer: Self-pay | Source: Home / Self Care | Attending: Orthopedic Surgery

## 2019-09-26 DIAGNOSIS — Z96642 Presence of left artificial hip joint: Secondary | ICD-10-CM | POA: Diagnosis present

## 2019-09-26 DIAGNOSIS — Z79899 Other long term (current) drug therapy: Secondary | ICD-10-CM

## 2019-09-26 DIAGNOSIS — M1611 Unilateral primary osteoarthritis, right hip: Principal | ICD-10-CM | POA: Diagnosis present

## 2019-09-26 DIAGNOSIS — Z6826 Body mass index (BMI) 26.0-26.9, adult: Secondary | ICD-10-CM

## 2019-09-26 DIAGNOSIS — Z20828 Contact with and (suspected) exposure to other viral communicable diseases: Secondary | ICD-10-CM | POA: Diagnosis present

## 2019-09-26 DIAGNOSIS — Z419 Encounter for procedure for purposes other than remedying health state, unspecified: Secondary | ICD-10-CM

## 2019-09-26 DIAGNOSIS — E669 Obesity, unspecified: Secondary | ICD-10-CM | POA: Diagnosis present

## 2019-09-26 DIAGNOSIS — Z96641 Presence of right artificial hip joint: Secondary | ICD-10-CM

## 2019-09-26 DIAGNOSIS — G8918 Other acute postprocedural pain: Secondary | ICD-10-CM

## 2019-09-26 DIAGNOSIS — I10 Essential (primary) hypertension: Secondary | ICD-10-CM | POA: Diagnosis present

## 2019-09-26 HISTORY — PX: TOTAL HIP ARTHROPLASTY: SHX124

## 2019-09-26 LAB — CBC
HCT: 37.3 % (ref 36.0–46.0)
Hemoglobin: 12.1 g/dL (ref 12.0–15.0)
MCH: 30.7 pg (ref 26.0–34.0)
MCHC: 32.4 g/dL (ref 30.0–36.0)
MCV: 94.7 fL (ref 80.0–100.0)
Platelets: 255 10*3/uL (ref 150–400)
RBC: 3.94 MIL/uL (ref 3.87–5.11)
RDW: 13.3 % (ref 11.5–15.5)
WBC: 7.5 10*3/uL (ref 4.0–10.5)
nRBC: 0 % (ref 0.0–0.2)

## 2019-09-26 LAB — CREATININE, SERUM
Creatinine, Ser: 0.86 mg/dL (ref 0.44–1.00)
GFR calc Af Amer: >60 mL/min (ref >60)
GFR calc non Af Amer: >60 mL/min (ref >60)

## 2019-09-26 SURGERY — ARTHROPLASTY, HIP, TOTAL, ANTERIOR APPROACH
Anesthesia: Spinal | Site: Hip | Laterality: Right

## 2019-09-26 MED ORDER — MENTHOL 3 MG MT LOZG
1.0000 | LOZENGE | OROMUCOSAL | Status: DC | PRN
  Filled 2019-09-26: qty 9

## 2019-09-26 MED ORDER — BUPIVACAINE LIPOSOME 1.3 % IJ SUSP
INTRAMUSCULAR | Status: AC
  Filled 2019-09-26: qty 20

## 2019-09-26 MED ORDER — PHENYLEPHRINE HCL (PRESSORS) 10 MG/ML IV SOLN
INTRAVENOUS | Status: AC
  Filled 2019-09-26: qty 1

## 2019-09-26 MED ORDER — ACETAMINOPHEN 325 MG PO TABS: 325.0000 mg | ORAL_TABLET | Freq: Four times a day (QID) | ORAL | Status: DC | PRN

## 2019-09-26 MED ORDER — PROPOFOL 500 MG/50ML IV EMUL
INTRAVENOUS | Status: DC | PRN
  Administered 2019-09-26: 50 ug/kg/min via INTRAVENOUS

## 2019-09-26 MED ORDER — DIPHENHYDRAMINE HCL 12.5 MG/5ML PO ELIX: 12.5000 mg | ORAL_SOLUTION | ORAL | Status: DC | PRN

## 2019-09-26 MED ORDER — OXYCODONE HCL 5 MG/5ML PO SOLN: 5.0000 mg | Freq: Once | ORAL | Status: DC | PRN

## 2019-09-26 MED ORDER — ACETAMINOPHEN 500 MG PO TABS
1000.0000 mg | ORAL_TABLET | Freq: Four times a day (QID) | ORAL | Status: DC
  Administered 2019-09-26 (×2): 1000 mg via ORAL
  Filled 2019-09-26 (×2): qty 2

## 2019-09-26 MED ORDER — METOCLOPRAMIDE HCL 5 MG/ML IJ SOLN: 5.0000 mg | Freq: Three times a day (TID) | INTRAMUSCULAR | Status: DC | PRN

## 2019-09-26 MED ORDER — NEOMYCIN-POLYMYXIN B GU 40-200000 IR SOLN
Status: AC
  Filled 2019-09-26: qty 4

## 2019-09-26 MED ORDER — BUPIVACAINE HCL (PF) 0.5 % IJ SOLN
INTRAMUSCULAR | Status: DC | PRN
  Administered 2019-09-26: 3 mL via INTRATHECAL

## 2019-09-26 MED ORDER — METHOCARBAMOL 1000 MG/10ML IJ SOLN
500.0000 mg | Freq: Four times a day (QID) | INTRAVENOUS | Status: DC | PRN
  Filled 2019-09-26: qty 5

## 2019-09-26 MED ORDER — MIDAZOLAM HCL 5 MG/5ML IJ SOLN
INTRAMUSCULAR | Status: DC | PRN
  Administered 2019-09-26 (×2): 2 mg via INTRAVENOUS

## 2019-09-26 MED ORDER — TRAMADOL HCL 50 MG PO TABS
50.0000 mg | ORAL_TABLET | Freq: Four times a day (QID) | ORAL | Status: DC
  Administered 2019-09-26 – 2019-09-27 (×8): 50 mg via ORAL
  Filled 2019-09-26 (×12): qty 1

## 2019-09-26 MED ORDER — EPINEPHRINE PF 1 MG/ML IJ SOLN
INTRAMUSCULAR | Status: AC
  Filled 2019-09-26: qty 1

## 2019-09-26 MED ORDER — BUPIVACAINE HCL (PF) 0.25 % IJ SOLN
INTRAMUSCULAR | Status: AC
  Filled 2019-09-26: qty 30

## 2019-09-26 MED ORDER — LIDOCAINE HCL (PF) 2 % IJ SOLN
INTRAMUSCULAR | Status: AC
  Filled 2019-09-26: qty 10

## 2019-09-26 MED ORDER — HYDROCODONE-ACETAMINOPHEN 7.5-325 MG PO TABS
1.0000 | ORAL_TABLET | ORAL | Status: DC | PRN
  Administered 2019-09-26 – 2019-09-28 (×7): 2 via ORAL
  Administered 2019-09-28: 14:00:00 1 via ORAL
  Administered 2019-09-28: 2 via ORAL
  Administered 2019-09-28 (×3): 1 via ORAL
  Administered 2019-09-28: 2 via ORAL
  Administered 2019-09-29 (×3): 1 via ORAL
  Filled 2019-09-26 (×2): qty 2
  Filled 2019-09-26: qty 1
  Filled 2019-09-26 (×2): qty 2
  Filled 2019-09-26: qty 1
  Filled 2019-09-26 (×2): qty 2
  Filled 2019-09-26: qty 1
  Filled 2019-09-26: qty 2
  Filled 2019-09-26: qty 1
  Filled 2019-09-26: qty 2
  Filled 2019-09-26: qty 1
  Filled 2019-09-26 (×3): qty 2
  Filled 2019-09-26: qty 1

## 2019-09-26 MED ORDER — LORATADINE 10 MG PO TABS
10.0000 mg | ORAL_TABLET | Freq: Every day | ORAL | Status: DC
  Administered 2019-09-26 – 2019-09-29 (×4): 10 mg via ORAL
  Filled 2019-09-26 (×4): qty 1

## 2019-09-26 MED ORDER — MEPERIDINE HCL 50 MG/ML IJ SOLN
6.2500 mg | INTRAMUSCULAR | Status: DC | PRN
  Administered 2019-09-26: 12.5 mg via INTRAVENOUS

## 2019-09-26 MED ORDER — RISAQUAD PO CAPS
1.0000 | ORAL_CAPSULE | Freq: Every day | ORAL | Status: DC
  Administered 2019-09-26 – 2019-09-29 (×4): 1 via ORAL
  Filled 2019-09-26 (×4): qty 1

## 2019-09-26 MED ORDER — FAMOTIDINE 20 MG PO TABS
ORAL_TABLET | ORAL | Status: AC
  Filled 2019-09-26: qty 1

## 2019-09-26 MED ORDER — LACTATED RINGERS IV SOLN
INTRAVENOUS | Status: DC
  Administered 2019-09-26: 07:00:00 via INTRAVENOUS

## 2019-09-26 MED ORDER — MIDAZOLAM HCL 2 MG/2ML IJ SOLN
INTRAMUSCULAR | Status: AC
  Filled 2019-09-26: qty 2

## 2019-09-26 MED ORDER — BUPIVACAINE-EPINEPHRINE 0.25% -1:200000 IJ SOLN
INTRAMUSCULAR | Status: DC | PRN
  Administered 2019-09-26: 30 mL

## 2019-09-26 MED ORDER — PHENOL 1.4 % MT LIQD
1.0000 | OROMUCOSAL | Status: DC | PRN
  Filled 2019-09-26: qty 177

## 2019-09-26 MED ORDER — CEFAZOLIN SODIUM-DEXTROSE 1-4 GM/50ML-% IV SOLN
1.0000 g | Freq: Four times a day (QID) | INTRAVENOUS | Status: AC
  Administered 2019-09-26 – 2019-09-27 (×3): 1 g via INTRAVENOUS
  Filled 2019-09-26 (×3): qty 50

## 2019-09-26 MED ORDER — ENOXAPARIN SODIUM 40 MG/0.4ML ~~LOC~~ SOLN
40.0000 mg | SUBCUTANEOUS | Status: DC
  Administered 2019-09-27 – 2019-09-29 (×3): 40 mg via SUBCUTANEOUS
  Filled 2019-09-26 (×3): qty 0.4

## 2019-09-26 MED ORDER — METHOCARBAMOL 500 MG PO TABS
500.0000 mg | ORAL_TABLET | Freq: Four times a day (QID) | ORAL | Status: DC | PRN
  Administered 2019-09-26 – 2019-09-29 (×2): 500 mg via ORAL
  Filled 2019-09-26 (×3): qty 1

## 2019-09-26 MED ORDER — GLYCOPYRROLATE 0.2 MG/ML IJ SOLN
INTRAMUSCULAR | Status: AC
  Filled 2019-09-26: qty 1

## 2019-09-26 MED ORDER — ONDANSETRON HCL 4 MG/2ML IJ SOLN
INTRAMUSCULAR | Status: AC
  Filled 2019-09-26: qty 2

## 2019-09-26 MED ORDER — FENTANYL CITRATE (PF) 100 MCG/2ML IJ SOLN
INTRAMUSCULAR | Status: DC | PRN
  Administered 2019-09-26: 100 ug via INTRAVENOUS

## 2019-09-26 MED ORDER — FENTANYL CITRATE (PF) 100 MCG/2ML IJ SOLN: 25.0000 ug | INTRAMUSCULAR | Status: DC | PRN

## 2019-09-26 MED ORDER — METOCLOPRAMIDE HCL 10 MG PO TABS: 5.0000 mg | ORAL_TABLET | Freq: Three times a day (TID) | ORAL | Status: DC | PRN

## 2019-09-26 MED ORDER — SODIUM CHLORIDE 0.9 % IV SOLN
INTRAVENOUS | Status: DC | PRN
  Administered 2019-09-26: 60 mL

## 2019-09-26 MED ORDER — DOCUSATE SODIUM 100 MG PO CAPS
100.0000 mg | ORAL_CAPSULE | Freq: Two times a day (BID) | ORAL | Status: DC
  Administered 2019-09-26 – 2019-09-29 (×6): 100 mg via ORAL
  Filled 2019-09-26 (×6): qty 1

## 2019-09-26 MED ORDER — SODIUM CHLORIDE FLUSH 0.9 % IV SOLN
INTRAVENOUS | Status: AC
  Filled 2019-09-26: qty 40

## 2019-09-26 MED ORDER — PANTOPRAZOLE SODIUM 40 MG PO TBEC
40.0000 mg | DELAYED_RELEASE_TABLET | Freq: Every day | ORAL | Status: DC
  Administered 2019-09-26 – 2019-09-29 (×4): 40 mg via ORAL
  Filled 2019-09-26 (×4): qty 1

## 2019-09-26 MED ORDER — FENTANYL CITRATE (PF) 100 MCG/2ML IJ SOLN
INTRAMUSCULAR | Status: AC
  Filled 2019-09-26: qty 2

## 2019-09-26 MED ORDER — ZOLPIDEM TARTRATE 5 MG PO TABS: 5.0000 mg | ORAL_TABLET | Freq: Every evening | ORAL | Status: DC | PRN

## 2019-09-26 MED ORDER — FAMOTIDINE 20 MG PO TABS
20.0000 mg | ORAL_TABLET | Freq: Once | ORAL | Status: AC
  Administered 2019-09-26: 06:00:00 20 mg via ORAL

## 2019-09-26 MED ORDER — MEPERIDINE HCL 50 MG/ML IJ SOLN
INTRAMUSCULAR | Status: AC
  Filled 2019-09-26: qty 1

## 2019-09-26 MED ORDER — ONDANSETRON HCL 4 MG PO TABS
4.0000 mg | ORAL_TABLET | Freq: Four times a day (QID) | ORAL | Status: DC | PRN
  Administered 2019-09-28: 4 mg via ORAL
  Filled 2019-09-26: qty 1

## 2019-09-26 MED ORDER — NEOMYCIN-POLYMYXIN B GU 40-200000 IR SOLN
Status: DC | PRN
  Administered 2019-09-26: 4 mL

## 2019-09-26 MED ORDER — BISOPROLOL-HYDROCHLOROTHIAZIDE 5-6.25 MG PO TABS
1.0000 | ORAL_TABLET | Freq: Every day | ORAL | Status: DC
  Administered 2019-09-26 – 2019-09-28 (×3): 1 via ORAL
  Filled 2019-09-26 (×4): qty 1

## 2019-09-26 MED ORDER — BUPIVACAINE HCL (PF) 0.5 % IJ SOLN
INTRAMUSCULAR | Status: AC
  Filled 2019-09-26: qty 10

## 2019-09-26 MED ORDER — PHENYLEPHRINE HCL (PRESSORS) 10 MG/ML IV SOLN
INTRAVENOUS | Status: DC | PRN
  Administered 2019-09-26: 200 ug via INTRAVENOUS
  Administered 2019-09-26 (×2): 100 ug via INTRAVENOUS

## 2019-09-26 MED ORDER — CEFAZOLIN SODIUM-DEXTROSE 2-4 GM/100ML-% IV SOLN
2.0000 g | Freq: Once | INTRAVENOUS | Status: AC
  Administered 2019-09-26: 2 g via INTRAVENOUS

## 2019-09-26 MED ORDER — PROPOFOL 10 MG/ML IV BOLUS
INTRAVENOUS | Status: AC
  Filled 2019-09-26: qty 20

## 2019-09-26 MED ORDER — LIDOCAINE HCL (PF) 2 % IJ SOLN
INTRAMUSCULAR | Status: DC | PRN
  Administered 2019-09-26: 50 mg

## 2019-09-26 MED ORDER — OXYCODONE HCL 5 MG PO TABS: 5.0000 mg | ORAL_TABLET | Freq: Once | ORAL | Status: DC | PRN

## 2019-09-26 MED ORDER — GLYCOPYRROLATE 0.2 MG/ML IJ SOLN
INTRAMUSCULAR | Status: DC | PRN
  Administered 2019-09-26: 0.2 mg via INTRAVENOUS

## 2019-09-26 MED ORDER — ALUM & MAG HYDROXIDE-SIMETH 200-200-20 MG/5ML PO SUSP: 30.0000 mL | ORAL | Status: DC | PRN

## 2019-09-26 MED ORDER — HYDROMORPHONE HCL 1 MG/ML IJ SOLN
0.5000 mg | INTRAMUSCULAR | Status: DC | PRN
  Administered 2019-09-26 – 2019-09-27 (×2): 1 mg via INTRAVENOUS
  Filled 2019-09-26 (×2): qty 1

## 2019-09-26 MED ORDER — SODIUM CHLORIDE 0.9 % IV SOLN
INTRAVENOUS | Status: DC | PRN
  Administered 2019-09-26: 30 ug/min via INTRAVENOUS

## 2019-09-26 MED ORDER — OXYCODONE HCL 5 MG PO TABS: 10.0000 mg | ORAL_TABLET | ORAL | Status: DC | PRN

## 2019-09-26 MED ORDER — MAGNESIUM HYDROXIDE 400 MG/5ML PO SUSP
30.0000 mL | Freq: Every day | ORAL | Status: DC | PRN
  Administered 2019-09-28: 30 mL via ORAL
  Filled 2019-09-26: qty 30

## 2019-09-26 MED ORDER — BISACODYL 10 MG RE SUPP
10.0000 mg | Freq: Every day | RECTAL | Status: DC | PRN
  Administered 2019-09-28: 10 mg via RECTAL
  Filled 2019-09-26: qty 1

## 2019-09-26 MED ORDER — OXYCODONE HCL 5 MG PO TABS
5.0000 mg | ORAL_TABLET | ORAL | Status: DC | PRN
  Administered 2019-09-26 (×2): 5 mg via ORAL
  Filled 2019-09-26 (×2): qty 1

## 2019-09-26 MED ORDER — MAGNESIUM CITRATE PO SOLN
1.0000 | Freq: Once | ORAL | Status: AC | PRN
  Administered 2019-09-28: 1 via ORAL
  Filled 2019-09-26 (×2): qty 296

## 2019-09-26 MED ORDER — CEFAZOLIN SODIUM-DEXTROSE 2-4 GM/100ML-% IV SOLN
INTRAVENOUS | Status: AC
  Filled 2019-09-26: qty 100

## 2019-09-26 MED ORDER — SODIUM CHLORIDE 0.9 % IV SOLN
INTRAVENOUS | Status: DC
  Administered 2019-09-26 (×2): via INTRAVENOUS

## 2019-09-26 MED ORDER — ONDANSETRON HCL 4 MG/2ML IJ SOLN
4.0000 mg | Freq: Four times a day (QID) | INTRAMUSCULAR | Status: DC | PRN
  Administered 2019-09-27 – 2019-09-28 (×2): 4 mg via INTRAVENOUS
  Filled 2019-09-26 (×2): qty 2

## 2019-09-26 SURGICAL SUPPLY — 59 items
BLADE SAGITTAL AGGR TOOTH XLG (BLADE) ×3 IMPLANT
BNDG COHESIVE 6X5 TAN STRL LF (GAUZE/BANDAGES/DRESSINGS) ×9 IMPLANT
CANISTER SUCT 1200ML W/VALVE (MISCELLANEOUS) ×3 IMPLANT
CANISTER WOUND CARE 500ML ATS (WOUND CARE) ×3 IMPLANT
CHLORAPREP W/TINT 26 (MISCELLANEOUS) ×3 IMPLANT
COVER BACK TABLE REUSABLE LG (DRAPES) ×3 IMPLANT
COVER WAND RF STERILE (DRAPES) ×3 IMPLANT
DRAPE 3/4 80X56 (DRAPES) ×9 IMPLANT
DRAPE C-ARM XRAY 36X54 (DRAPES) ×3 IMPLANT
DRAPE INCISE IOBAN 66X60 STRL (DRAPES) IMPLANT
DRAPE POUCH INSTRU U-SHP 10X18 (DRAPES) ×3 IMPLANT
DRESSING SURGICEL FIBRLLR 1X2 (HEMOSTASIS) ×2 IMPLANT
DRSG OPSITE POSTOP 4X8 (GAUZE/BANDAGES/DRESSINGS) ×6 IMPLANT
DRSG SURGICEL FIBRILLAR 1X2 (HEMOSTASIS) ×6
ELECT BLADE 6.5 EXT (BLADE) ×3 IMPLANT
ELECT REM PT RETURN 9FT ADLT (ELECTROSURGICAL) ×3
ELECTRODE REM PT RTRN 9FT ADLT (ELECTROSURGICAL) ×1 IMPLANT
GLOVE BIOGEL PI IND STRL 9 (GLOVE) ×1 IMPLANT
GLOVE BIOGEL PI INDICATOR 9 (GLOVE) ×2
GLOVE SURG SYN 9.0  PF PI (GLOVE) ×4
GLOVE SURG SYN 9.0 PF PI (GLOVE) ×2 IMPLANT
GOWN SRG 2XL LVL 4 RGLN SLV (GOWNS) ×1 IMPLANT
GOWN STRL NON-REIN 2XL LVL4 (GOWNS) ×2
GOWN STRL REUS W/ TWL LRG LVL3 (GOWN DISPOSABLE) ×1 IMPLANT
GOWN STRL REUS W/TWL LRG LVL3 (GOWN DISPOSABLE) ×2
HEAD FEMORAL 28MM SZ S (Head) ×3 IMPLANT
HEMOVAC 400CC 10FR (MISCELLANEOUS) IMPLANT
HOLDER FOLEY CATH W/STRAP (MISCELLANEOUS) ×3 IMPLANT
HOOD PEEL AWAY FLYTE STAYCOOL (MISCELLANEOUS) ×3 IMPLANT
KIT PREVENA INCISION MGT 13 (CANNISTER) ×3 IMPLANT
LINER DBL MOB SZ 0 52MM (Liner) ×3 IMPLANT
MAT ABSORB  FLUID 56X50 GRAY (MISCELLANEOUS) ×2
MAT ABSORB FLUID 56X50 GRAY (MISCELLANEOUS) ×1 IMPLANT
NDL SAFETY ECLIPSE 18X1.5 (NEEDLE) ×1 IMPLANT
NEEDLE HYPO 18GX1.5 SHARP (NEEDLE) ×2
NEEDLE SPNL 20GX3.5 QUINCKE YW (NEEDLE) ×6 IMPLANT
NS IRRIG 1000ML POUR BTL (IV SOLUTION) ×3 IMPLANT
PACK HIP COMPR (MISCELLANEOUS) ×3 IMPLANT
SCALPEL PROTECTED #10 DISP (BLADE) ×6 IMPLANT
SHELL ACETABULAR SZ 52 DM (Shell) ×3 IMPLANT
SOL PREP PVP 2OZ (MISCELLANEOUS) ×3
SOLUTION PREP PVP 2OZ (MISCELLANEOUS) ×1 IMPLANT
SPONGE DRAIN TRACH 4X4 STRL 2S (GAUZE/BANDAGES/DRESSINGS) ×3 IMPLANT
STAPLER SKIN PROX 35W (STAPLE) ×3 IMPLANT
STEM FEMORAL SZ2 STD COLLARED (Stem) ×3 IMPLANT
STRAP SAFETY 5IN WIDE (MISCELLANEOUS) ×3 IMPLANT
SUT DVC 2 QUILL PDO  T11 36X36 (SUTURE) ×2
SUT DVC 2 QUILL PDO T11 36X36 (SUTURE) ×1 IMPLANT
SUT SILK 0 (SUTURE) ×2
SUT SILK 0 30XBRD TIE 6 (SUTURE) ×1 IMPLANT
SUT V-LOC 90 ABS DVC 3-0 CL (SUTURE) ×3 IMPLANT
SUT VIC AB 1 CT1 36 (SUTURE) ×3 IMPLANT
SYR 20ML LL LF (SYRINGE) ×3 IMPLANT
SYR 30ML LL (SYRINGE) ×3 IMPLANT
SYR 50ML LL SCALE MARK (SYRINGE) ×6 IMPLANT
SYR BULB IRRIG 60ML STRL (SYRINGE) ×3 IMPLANT
TAPE MICROFOAM 4IN (TAPE) ×3 IMPLANT
TOWEL OR 17X26 4PK STRL BLUE (TOWEL DISPOSABLE) ×3 IMPLANT
TRAY FOLEY MTR SLVR 16FR STAT (SET/KITS/TRAYS/PACK) ×3 IMPLANT

## 2019-09-26 NOTE — Op Note (Signed)
09/26/2019  8:44 AM  PATIENT:  Laura Mckay  63 y.o. female  PRE-OPERATIVE DIAGNOSIS:  PRIMARY OSTEOARTHRITIS OF RIGHT HIP  POST-OPERATIVE DIAGNOSIS:  PRIMARY OSTEOARTHRITIS OF RIGHT HIP  PROCEDURE:  Procedure(s): TOTAL HIP ARTHROPLASTY ANTERIOR APPROACH (Right)  SURGEON: Laurene Footman, MD  ASSISTANTS: None  ANESTHESIA:   spinal  EBL:  Total I/O In: 600 [I.V.:600] Out: 260 [Urine:60; Blood:200]  BLOOD ADMINISTERED:none  DRAINS: none   LOCAL MEDICATIONS USED:  MARCAINE    and OTHER Exparel  SPECIMEN:  Source of Specimen:   Right femoral head  DISPOSITION OF SPECIMEN:  PATHOLOGY  COUNTS:  YES  TOURNIQUET:  * No tourniquets in log *  IMPLANTS: Medacta AMIS 2 standard, 52 mm Mpact DM cup and liner with ceramic S 28 mm head  DICTATION: .Dragon Dictation   The patient was brought to the operating room and after spinal anesthesia was obtained patient was placed on the operative table with the ipsilateral foot into the Medacta attachment, contralateral leg on a well-padded table. C-arm was brought in and preop template x-ray taken. After prepping and draping in usual sterile fashion appropriate patient identification and timeout procedures were completed. Anterior approach to the hip was obtained and centered over the greater trochanter and TFL muscle. The subcutaneous tissue was incised hemostasis being achieved by electrocautery. TFL fascia was incised and the muscle retracted laterally deep retractor placed. The lateral femoral circumflex vessels were identified and ligated. The anterior capsule was exposed and a capsulotomy performed. The neck was identified and a femoral neck cut carried out with a saw. The head was removed without difficulty and showed sclerotic femoral head and acetabulum. Reaming was carried out to 50 mm and a 52 mm cup trial gave appropriate tightness to the acetabular component a 52 DM cup was impacted into position. The leg was then externally  rotated and ischiofemoral and pubofemoral releases carried out. The femur was sequentially broached to a size 2, size 2 standard with S head trials were placed and the final components chosen. The 2 standard stem was inserted along with a ceramic S 28 mm head and 52 mm liner. The hip was reduced and was stable the wound was thoroughly irrigated with fibrillar placed along the posterior capsule and medial neck. The deep fascia ws closed using a heavy Quill after infiltration of 30 cc of quarter percent Sensorcaine with epinephrine. Exparel is injected throughout the case to aid in postop analgesia.  3-0 V-loc to close the skin with skin staples.  Incisional wound VAC applied and patient was sent to recovery in stable condition.   PLAN OF CARE: Admit to inpatient

## 2019-09-26 NOTE — H&P (Signed)
Reviewed paper H+P, will be scanned into chart. No changes noted.  

## 2019-09-26 NOTE — Anesthesia Procedure Notes (Signed)
Spinal  Patient location during procedure: OR Staffing Anesthesiologist: Piscitello, Precious Haws, MD Resident/CRNA: Rolla Plate, CRNA Performed: resident/CRNA  Preanesthetic Checklist Completed: patient identified, site marked, surgical consent, pre-op evaluation, timeout performed, IV checked, risks and benefits discussed and monitors and equipment checked Spinal Block Patient position: sitting Prep: ChloraPrep and site prepped and draped Patient monitoring: heart rate, continuous pulse ox, blood pressure and cardiac monitor Approach: midline Location: L4-5 Injection technique: single-shot Needle Needle type: Introducer and Pencan  Needle gauge: 24 G Needle length: 9 cm Additional Notes Negative paresthesia. Negative blood return. Positive free-flowing CSF. Expiration date of kit checked and confirmed. Patient tolerated procedure well, without complications.

## 2019-09-26 NOTE — TOC Benefit Eligibility Note (Signed)
Transition of Care [facility]) Benefit Eligibility Note    Patient Details  Name: Laura Mckay MRN: [id number] Date of Birth: 1956-09-13   Medication/Dose: Carron Curie  AND ENOXAPARIN   40 MG DAILY X 14  SYRINGES  Covered?: Yes  Tier: (NO TIER)  Prescription Coverage [facility] , 90 DAY SUPPLY FOR  M/O ZERO DOLLARS  Spoke with Person/Company/Phone Number:: RHIANNON @ [facility] #  [phone] OPT- MEMBER  Co-Pay: ZERO DOLLARS  FOR BRAND AND GENERIC  Prior Approval: No  Deductible: (NO DEDUCTIBLE  AND NO OUT-OF-POCKET)       Memory Argue Phone Number: 09/26/2019, 11:13 AM

## 2019-09-26 NOTE — TOC Initial Note (Addendum)
Transition of Care [facility]) - Initial/Assessment Note    Patient Details  Name: Laura Mckay MRN: [id number] Date of Birth: 03-14-1956  Transition of Care [facility]) CM/SW Contact:    Laura Mckay, Laura Mckay Phone Number: [phone]  09/26/2019, 3:02 PM  Clinical Narrative: Clinical Social Worker (CSW) met with patient and her husband Laura Mckay was at bedside. Patient was alert and oriented X4 and was sitting up in the bed. CSW introduced self and explained role of CSW department. Per patient she lives in [place] the [place] with her husband Laura Mckay. Per patient she has a rolling walker and bedside commode at home. Per patient she had surgery in June 2020 and had [facility] for home health. Per husband they had a PT named Laura Mckay that lived 6 miles down the road from them that worked with [facility]. Laura Mckay [facility] home health representative is aware of above and reported [facility] can accept patient. CSW made patient aware that her Lovenox will be $0. Patient reported no other needs or concerns. CSW will continue to follow and assist as needed.                     Expected Discharge Plan: [place] Barriers to Discharge: Continued Medical Work up   Patient Goals and CMS Choice Patient states their goals for this hospitalization and ongoing recovery are:: To go home. CMS Medicare.gov Compare Post Acute Care list provided to:: Patient Choice offered to / list presented to : Patient  Expected Discharge Plan and Services Expected Discharge Plan: [place] In-house Referral: Clinical Social Work   Post Acute Care Choice: [place] arrangements for the past 2 months: [place]                 DME Arranged: N/A         HH Arranged: PT [place] Agency: [facility] [employer] [facility] [employer] Date [place]: 09/26/19 Time [place]: [phone] Representative spoke with at [place]: [place]  Arrangements/Services Living arrangements for the past 2 months: [place] Lives with:: Spouse Patient language and need for interpreter reviewed:: No Do you feel safe going back to the place where you live?: Yes      Need for Family Participation in Patient Care: No (Comment) Care giver support system in place?: Yes (comment) Current home services: Home PT Criminal Activity/Legal Involvement Pertinent to Current Situation/Hospitalization: No - Comment as needed  Activities of Daily Living Home Assistive Devices/Equipment: Bedside commode/3-in-1, Cane (specify quad or straight), Walker (specify type) ADL Screening (condition at time of admission) Patient's cognitive ability adequate to safely complete daily activities?: Yes Is the patient deaf or have difficulty hearing?: No Does the patient have difficulty seeing, even when wearing glasses/contacts?: No Does the patient have difficulty concentrating, remembering, or making decisions?: No Patient able to express need for assistance with ADLs?: Yes Does the patient have difficulty dressing or bathing?: Yes Independently performs ADLs?: No Does the patient have difficulty walking or climbing stairs?: Yes Weakness of Legs: Right Weakness of Arms/Hands: None  Permission Sought/Granted Permission sought to share information with : Other (comment)(Home Health agency) Permission granted to share information with : Yes, Verbal Permission Granted              Emotional Assessment Appearance:: Appears stated age Attitude/Demeanor/Rapport: Engaged Affect (typically observed): Pleasant, Calm Orientation: : Oriented to Self, Oriented to Place, Oriented  to  Time, Oriented to Situation Alcohol / Substance Use: Not Applicable Psych Involvement: No (comment)  Admission diagnosis:  PRIMARY OSTEOARTHRITIS OF RIGHT HIP Patient Active Problem List   Diagnosis Date Noted  . Status post total hip replacement, right 09/26/2019  . Status  post total hip replacement, left 06/25/2019   PCP:  Laura Crouch, MD [id number] [place] [place] [id number]: [phone]: [phone]    Social Determinants of Health (SDOH) Interventions    Readmission Risk Interventions No flowsheet data found.

## 2019-09-26 NOTE — Transfer of Care (Signed)
Immediate Anesthesia Transfer of Care Note  Patient: Laura Mckay  Procedure(s) Performed: TOTAL HIP ARTHROPLASTY ANTERIOR APPROACH (Right Hip)  Patient Location: PACU  Anesthesia Type:Spinal  Level of Consciousness: awake, alert  and oriented  Airway & Oxygen Therapy: Patient Spontanous Breathing  Post-op Assessment: Report given to RN and Post -op Vital signs reviewed and stable  Post vital signs: Reviewed and stable  Last Vitals:  Vitals Value Taken Time  BP 150/135 09/26/19 0842  Temp 37.1 C 09/26/19 0842  Pulse 86 09/26/19 0845  Resp 19 09/26/19 0845  SpO2 100 % 09/26/19 0845  Vitals shown include unvalidated device data.  Last Pain:  Vitals:   09/26/19 0842  TempSrc: Temporal  PainSc: 0-No pain         Complications: No apparent anesthesia complications

## 2019-09-26 NOTE — Anesthesia Post-op Follow-up Note (Signed)
Anesthesia QCDR form completed.        

## 2019-09-26 NOTE — TOC Progression Note (Signed)
Transition of Care [facility]) - Progression Note    Patient Details  Name: Laura Mckay MRN: [id number] Date of Birth: [birth year]  Transition of Care [facility]) CM/SW Contact  Tiari Andringa, [name] Phone Number: [phone]  09/26/2019, 10:28 AM  Clinical Narrative: Lovenox price requested.          Expected Discharge Plan and Services                                                 Social Determinants of Health (SDOH) Interventions    Readmission Risk Interventions No flowsheet data found.

## 2019-09-26 NOTE — Evaluation (Addendum)
Physical Therapy Evaluation Patient Details Name: Laura Mckay MRN: [id number] DOB: 09-02-1956 Today's Date: 09/26/2019   History of Present Illness  Patient is a 63 year old female s/p Right THA, direct anterior approach. Patient is WBAT. PMH to include OA, HTN, DDD and recent L THA.    Clinical Impression  Patient in bed, alert, oriented, mildly distressed about her pain levels, light touch sensation returned. Pt initially reported R hip pain as 3-4/10 Pt had received pain medication prior to PT. Pt stated she lives with her husband, independent at baseline.   With initiation of PT evaluation and therapeutic exercises, pt became very upset, crying, and endorsed significant pain levels with any RLE movements. PT evaluation limited by pain and would benefit from further assessment tomorrow AM. Overall the patient demonstrated deficits compared to PLOF and would benefit from skilled PT intervention to return patient PLOF as able. Recommendation is HHPT with supervision/assistance- 24hr pending patient progress.    Follow Up Recommendations Home health PT;Supervision/Assistance - 24 hour    Equipment Recommendations  None recommended by PT;Other (comment)(Pt has RW at home)    Recommendations for Other Services   OT consult    Precautions / Restrictions Precautions Precautions: Anterior Hip Precaution Booklet Issued: Yes (comment) Restrictions Weight Bearing Restrictions: Yes RLE Weight Bearing: Weight bearing as tolerated      Mobility  Bed Mobility               General bed mobility comments: deferred due to significantly increased pain  Transfers                    Ambulation/Gait                Stairs            Wheelchair Mobility    Modified Rankin (Stroke Patients Only)       Balance                                             Pertinent Vitals/Pain Pain Assessment: 0-10 Pain Score: 4  Pain Location: R  hip, pt initially reported 4/10 for hip pain, but patient became very tearful and disraught with exercises, RN notified Pain Descriptors / Indicators: Crying;Grimacing;Moaning Pain Intervention(s): Limited activity within patient's tolerance;Patient requesting pain meds-RN notified;Monitored during session;Ice applied;Premedicated before session;Repositioned    Home Living Family/patient expects to be discharged to:: Private residence Living Arrangements: Spouse/significant other Available Help at Discharge: Family Type of Home: House Home Access: Stairs to enter Entrance Stairs-Rails: Can reach both;Right;Left Entrance Stairs-Number of Steps: [place]: One level Home Equipment: Environmental consultant - 2 wheels;Shower seat      Prior Function Level of Independence: Independent               Hand Dominance   Dominant Hand: Right    Extremity/Trunk Assessment   Upper Extremity Assessment Upper Extremity Assessment: Defer to OT evaluation    Lower Extremity Assessment Lower Extremity Assessment: RLE deficits/detail;LLE deficits/detail RLE Deficits / Details: very limited ability to assess due to significant pain s/p R THA LLE Deficits / Details: also difficult to assess but patient able to perform SLR/heel slide independently    Cervical / Trunk Assessment Cervical / Trunk Assessment: Normal  Communication   Communication: No difficulties  Cognition Arousal/Alertness: Awake/alert Behavior During Therapy: WFL for tasks assessed/performed Overall  Cognitive Status: Within Functional Limits for tasks assessed                                        General Comments      Exercises Total Joint Exercises Ankle Circles/Pumps: AROM;Both;10 reps Quad Sets: AROM;Strengthening;Right;10 reps Gluteal Sets: AROM;Strengthening;Both;10 reps   Assessment/Plan    PT Assessment Patient needs continued PT services  PT Problem List Decreased strength;Decreased  mobility;Decreased safety awareness;Decreased range of motion;Decreased knowledge of precautions;Decreased activity tolerance;Decreased balance;Decreased knowledge of use of DME;Pain       PT Treatment Interventions DME instruction;Therapeutic exercise;Gait training;Balance training;Stair training;Neuromuscular re-education;Functional mobility training;Therapeutic activities;Patient/family education    PT Goals (Current goals can be found in the Care Plan section)  Acute Rehab PT Goals Patient Stated Goal: to go home PT Goal Formulation: With patient Time For Goal Achievement: 10/10/19 Potential to Achieve Goals: Good    Frequency BID   Barriers to discharge        Co-evaluation               AM-PAC PT "6 Clicks" Mobility  Outcome Measure Help needed turning from your back to your side while in a flat bed without using bedrails?: A Lot Help needed moving from lying on your back to sitting on the side of a flat bed without using bedrails?: A Lot Help needed moving to and from a bed to a chair (including a wheelchair)?: A Lot Help needed standing up from a chair using your arms (e.g., wheelchair or bedside chair)?: Total Help needed to walk in hospital room?: Total Help needed climbing 3-5 steps with a railing? : Total 6 Click Score: 9    End of Session Equipment Utilized During Treatment: Gait belt Activity Tolerance: Patient limited by pain Patient left: in bed;with call bell/phone within reach;with bed alarm set;with SCD's reapplied Nurse Communication: Mobility status PT Visit Diagnosis: Other abnormalities of gait and mobility (R26.89);Muscle weakness (generalized) (M62.81);Difficulty in walking, not elsewhere classified (R26.2);Pain Pain - Right/Left: Right Pain - part of body: Hip    Time: 4008-6761 PT Time Calculation (min) (ACUTE ONLY): 20 min   Charges:   PT Evaluation $PT Eval Low Complexity: 1 Low PT Treatments $Therapeutic Exercise: 8-22 mins        Olga Coaster PT, DPT [phone] PM,09/26/19 [phone]

## 2019-09-26 NOTE — Progress Notes (Signed)
Pt continues to shiver. Dr. Lavone Neri notified. Acknowledged. Orders received.

## 2019-09-26 NOTE — Anesthesia Preprocedure Evaluation (Signed)
Anesthesia Evaluation  Patient identified by MRN, date of birth, ID band Patient awake    Reviewed: Allergy & Precautions, NPO status , Patient's Chart, lab work & pertinent test results  History of Anesthesia Complications (+) PONV and history of anesthetic complications  Airway Mallampati: II  TM Distance: <3 FB Neck ROM: Full    Dental  (+) Chipped   Pulmonary neg pulmonary ROS, neg sleep apnea, neg COPD,    breath sounds clear to auscultation- rhonchi (-) wheezing      Cardiovascular hypertension, Pt. on medications (-) CAD, (-) Past MI, (-) Cardiac Stents and (-) CABG  Rhythm:Regular Rate:Normal - Systolic murmurs and - Diastolic murmurs    Neuro/Psych neg Seizures negative neurological ROS  negative psych ROS   GI/Hepatic negative GI ROS, Neg liver ROS,   Endo/Other  negative endocrine ROSneg diabetes  Renal/GU negative Renal ROS     Musculoskeletal  (+) Arthritis ,   Abdominal (+) - obese,   Peds  Hematology negative hematology ROS (+)   Anesthesia Other Findings Past Medical History: No date: Arthritis     Comment:  hips No date: Complication of anesthesia No date: Hx of degenerative disc disease No date: Hypertension No date: PONV (postoperative nausea and vomiting)   Reproductive/Obstetrics                             Lab Results  Component Value Date   WBC 6.3 09/18/2019   HGB 13.7 09/18/2019   HCT 42.1 09/18/2019   MCV 93.1 09/18/2019   PLT 349 09/18/2019    Anesthesia Physical  Anesthesia Plan  ASA: II  Anesthesia Plan: Spinal   Post-op Pain Management:    Induction:   PONV Risk Score and Plan: 3 and Propofol infusion  Airway Management Planned: Natural Airway  Additional Equipment:   Intra-op Plan:   Post-operative Plan:   Informed Consent: I have reviewed the patients History and Physical, chart, labs and discussed the procedure including the  risks, benefits and alternatives for the proposed anesthesia with the patient or authorized representative who has indicated his/her understanding and acceptance.     Dental advisory given  Plan Discussed with: CRNA and Anesthesiologist  Anesthesia Plan Comments: (Patient reports no bleeding problems and no anticoagulant use.  Plan for spinal with backup GA  Patient consented for risks of anesthesia including but not limited to:  - adverse reactions to medications - risk of bleeding, infection, nerve damage and headache - risk of failed spinal - damage to teeth, lips or other oral mucosa - sore throat or hoarseness - Damage to heart, brain, lungs or loss of life  Patient voiced understanding.)        Anesthesia Quick Evaluation

## 2019-09-27 LAB — CBC
HCT: 35.2 % — ABNORMAL LOW (ref 36.0–46.0)
Hemoglobin: 11.5 g/dL — ABNORMAL LOW (ref 12.0–15.0)
MCH: 30.4 pg (ref 26.0–34.0)
MCHC: 32.7 g/dL (ref 30.0–36.0)
MCV: 93.1 fL (ref 80.0–100.0)
Platelets: 219 10*3/uL (ref 150–400)
RBC: 3.78 MIL/uL — ABNORMAL LOW (ref 3.87–5.11)
RDW: 13.3 % (ref 11.5–15.5)
WBC: 7.3 10*3/uL (ref 4.0–10.5)
nRBC: 0 % (ref 0.0–0.2)

## 2019-09-27 LAB — SURGICAL PATHOLOGY

## 2019-09-27 LAB — BASIC METABOLIC PANEL
Anion gap: 7 (ref 5–15)
BUN: 10 mg/dL (ref 8–23)
CO2: 23 mmol/L (ref 22–32)
Calcium: 8.4 mg/dL — ABNORMAL LOW (ref 8.9–10.3)
Chloride: 105 mmol/L (ref 98–111)
Creatinine, Ser: 0.7 mg/dL (ref 0.44–1.00)
GFR calc Af Amer: >60 mL/min (ref >60)
GFR calc non Af Amer: >60 mL/min (ref >60)
Glucose, Bld: 126 mg/dL — ABNORMAL HIGH (ref 70–99)
Potassium: 4 mmol/L (ref 3.5–5.1)
Sodium: 135 mmol/L (ref 135–145)

## 2019-09-27 MED ORDER — DOCUSATE SODIUM 100 MG PO CAPS: 100.0000 mg | ORAL_CAPSULE | Freq: Two times a day (BID) | ORAL | 0 refills | Status: AC

## 2019-09-27 MED ORDER — METHOCARBAMOL 500 MG PO TABS: 500.0000 mg | ORAL_TABLET | Freq: Four times a day (QID) | ORAL | 0 refills | Status: AC | PRN

## 2019-09-27 MED ORDER — TRAMADOL HCL 50 MG PO TABS: 50.0000 mg | ORAL_TABLET | Freq: Four times a day (QID) | ORAL | 0 refills | Status: AC | PRN

## 2019-09-27 MED ORDER — HYDROCODONE-ACETAMINOPHEN 7.5-325 MG PO TABS: 1.0000 | ORAL_TABLET | ORAL | 0 refills | Status: AC | PRN

## 2019-09-27 MED ORDER — ENOXAPARIN SODIUM 40 MG/0.4ML ~~LOC~~ SOLN: 40.0000 mg | SUBCUTANEOUS | 0 refills | Status: DC

## 2019-09-27 NOTE — Discharge Summary (Addendum)
Physician Discharge Summary  Patient ID: Laura Mckay MRN: [id number] DOB/AGE: 1956/04/25 63 y.o.  Admit date: 09/26/2019 Discharge date: 09/29/2019 Admission Diagnoses:  PRIMARY OSTEOARTHRITIS OF RIGHT HIP   Discharge Diagnoses: Patient Active Problem List   Diagnosis Date Noted  . Status post total hip replacement, right 09/26/2019  . Status post total hip replacement, left 06/25/2019    Past Medical History:  Diagnosis Date  . Arthritis    hips  . Complication of anesthesia   . Hx of degenerative disc disease   . Hypertension   . PONV (postoperative nausea and vomiting)      Transfusion: none   Consultants (if any):   Discharged Condition: Improved  Hospital Course: Laura Mckay is an 63 y.o. female who was admitted 09/26/2019 with a diagnosis of right hip osteoarthritis and went to the operating room on 09/26/2019 and underwent the above named procedures.    Surgeries: Procedure(s): TOTAL HIP ARTHROPLASTY ANTERIOR APPROACH on 09/26/2019 Patient tolerated the surgery well. Taken to PACU where she was stabilized and then transferred to the orthopedic floor.  Started on Lovenox 40 mg  q 24 hrs. Foot pumps applied bilaterally at 80 mm. Heels elevated on bed with rolled towels. No evidence of DVT. Negative Homan. Physical therapy started on day #1 for gait training and transfer. OT started day #1 for ADL and assisted devices.  Patient's foley was d/c on day #1. Patient's IV was d/c on day #2.  On post op day #2 patient was stable and ready for discharge to home with HHPT.  The patient was unable to go home on postop day 2 because of inability to have a bowel movement.  The patient had a bowel movement on postop day 3.  She is ready to go home.  Implants: Medacta AMIS2 standard, 52 mmMpactDM cup and liner with ceramic S 28 mm head  She was given perioperative antibiotics:  Anti-infectives (From admission, onward)   Start     Dose/Rate Route Frequency  Ordered Stop   09/26/19 1330  ceFAZolin (ANCEF) IVPB 1 g/50 mL premix     1 g 100 mL/hr over 30 Minutes Intravenous Every 6 hours 09/26/19 1024 09/27/19 0119   09/26/19 0615  ceFAZolin (ANCEF) IVPB 2g/100 mL premix     2 g 200 mL/hr over 30 Minutes Intravenous  Once 09/26/19 0600 09/26/19 0757   09/26/19 0609  ceFAZolin (ANCEF) 2-4 GM/100ML-% IVPB    Note to Pharmacy: Laura Mckay   : cabinet override      09/26/19 0609 09/26/19 0727    .  She was given sequential compression devices, early ambulation, and Lovenox for DVT prophylaxis.  She benefited maximally from the hospital stay and there were no complications.    Recent vital signs:  Vitals:   09/28/19 2049 09/28/19 2331  BP: 123/70 115/68  Pulse: 89 77  Resp:  16  Temp:  (!) 100.7 F (38.2 C)  SpO2:  100%    Recent laboratory studies:  Lab Results  Component Value Date   HGB 11.5 (L) 09/27/2019   HGB 12.1 09/26/2019   HGB 13.7 09/18/2019   Lab Results  Component Value Date   WBC 7.3 09/27/2019   PLT 219 09/27/2019   Lab Results  Component Value Date   INR 1.0 09/18/2019   Lab Results  Component Value Date   NA 135 09/27/2019   K 4.0 09/27/2019   CL 105 09/27/2019   CO2 23 09/27/2019   BUN 10 09/27/2019  CREATININE 0.70 09/27/2019   GLUCOSE 126 (H) 09/27/2019    Discharge Medications:   Allergies as of 09/29/2019   No Known Allergies     Medication List    STOP taking these medications   acetaminophen 500 MG tablet Commonly known as: TYLENOL   ibuprofen 200 MG tablet Commonly known as: ADVIL     TAKE these medications   bisoprolol-hydrochlorothiazide 5-6.25 MG tablet Commonly known as: ZIAC Take 1 tablet by mouth at bedtime.   desloratadine 5 MG tablet Commonly known as: CLARINEX Take 5 mg by mouth at bedtime.   docusate sodium 100 MG capsule Commonly known as: COLACE Take 1 capsule (100 mg total) by mouth 2 (two) times daily.   enoxaparin 40 MG/0.4ML injection Commonly known  as: LOVENOX Inject 0.4 mLs (40 mg total) into the skin daily for 14 days.   HYDROcodone-acetaminophen 7.5-325 MG tablet Commonly known as: NORCO Take 1-2 tablets by mouth every 4 (four) hours as needed for moderate pain.   methocarbamol 500 MG tablet Commonly known as: ROBAXIN Take 1 tablet (500 mg total) by mouth every 6 (six) hours as needed for muscle spasms.   PROBIOTIC ADVANCED PO Take 1 tablet by mouth daily.   traMADol 50 MG tablet Commonly known as: ULTRAM Take 1 tablet (50 mg total) by mouth every 6 (six) hours as needed. What changed: how much to take       Diagnostic Studies: Dg Hip Operative Unilat W Or W/o Pelvis Right  Result Date: 09/26/2019 CLINICAL DATA:  Right hip replacement. EXAM: OPERATIVE RIGHT HIP (WITH PELVIS IF PERFORMED) two VIEWS TECHNIQUE: Fluoroscopic spot image(s) were submitted for interpretation post-operatively. COMPARISON:  None. FINDINGS: Pre and postoperative single-view AP images of a total right hip arthroplasty are noted. The alignment appears maintained on the single view radiographs. IMPRESSION: Intraoperative radiographs as described above. Electronically Signed   By: Romona Curls M.D.   On: 09/26/2019 09:19   Dg Hip Unilat W Or W/o Pelvis 2-3 Views Right  Result Date: 09/26/2019 CLINICAL DATA:  Postoperative right hip arthroplasty. EXAM: DG HIP (WITH OR WITHOUT PELVIS) 2-3V RIGHT COMPARISON:  None. FINDINGS: The patient is status post a right hip arthroplasty. Skin staples overlie the right hip. A surgical drain is seen at the operative site. There is no evidence of hip dislocation. IMPRESSION: Postoperative changes from right hip arthroplasty. Electronically Signed   By: Romona Curls M.D.   On: 09/26/2019 09:21    Disposition: Discharge disposition: 01-Home or Self Care         Follow-up Information    Evon Slack, PA-C In 2 weeks.   Specialties: Orthopedic Surgery, Emergency Medicine Why: Patient will need to make a follow  up appointment. Contact information: [address] [place] [address] [phone]            Signed: Lenard Forth, Rayne Loiseau 09/29/2019, 7:24 AM

## 2019-09-27 NOTE — Progress Notes (Signed)
Physical Therapy Treatment Patient Details Name: Laura Mckay MRN: [id number] DOB: 06-Mar-1956 Today's Date: 09/27/2019    History of Present Illness Patient is a 63 year old female s/p Right THA, direct anterior approach. Patient is WBAT. PMH to include OA, HTN, DDD and recent L THA.    PT Comments    Patient alert, pain 7/10 for R hip, premedicated prior to PT session. Session focused on promoting functional mobility. Supine to sit with minA for RLE management, and performed several sit <> stand with RW and CGA (from bed, chair, and BSC over standard commode). Pt ambulated in total ~60ft this session, needed several extended sitting rest breaks pre and post mobility. Pt ambulated with step to gait pattern, endorsed difficulty lifting RLE to clear the floor and increased pain with ambulation. Pt up in chair with comfort measures/all needs in reach, reported nausea. RN notified and PT provided crackers and gingerale. The patient would benefit from further skilled PT intervention to maximize mobility, safety, and independence.    Follow Up Recommendations  Home health PT;Supervision/Assistance - 24 hour     Equipment Recommendations  None recommended by PT;Other (comment)    Recommendations for Other Services       Precautions / Restrictions Precautions Precautions: Anterior Hip Restrictions Weight Bearing Restrictions: Yes RLE Weight Bearing: Weight bearing as tolerated    Mobility  Bed Mobility Overal bed mobility: Needs Assistance Bed Mobility: Supine to Sit     Supine to sit: Min assist     General bed mobility comments: assistance provided for RLE management  Transfers Overall transfer level: Needs assistance Equipment used: Rolling walker (2 wheeled) Transfers: Sit to/from Stand Sit to Stand: Min guard            Ambulation/Gait Ambulation/Gait assistance: Min guard Gait Distance (Feet): 30 Feet Assistive device: Rolling walker (2 wheeled)        General Gait Details: Pt with step to gait pattern (instructed by PT) difficulty clearing RLE from floor, very slow ambulation. Pt endorsed increased pain with ambulation.   Stairs             Wheelchair Mobility    Modified Rankin (Stroke Patients Only)       Balance Overall balance assessment: Needs assistance Sitting-balance support: Feet supported Sitting balance-Leahy Scale: Fair       Standing balance-Leahy Scale: Fair Standing balance comment: Able to stand and use hand sanitizer with both hands                            Cognition Arousal/Alertness: Awake/alert Behavior During Therapy: WFL for tasks assessed/performed Overall Cognitive Status: Within Functional Limits for tasks assessed                                        Exercises Other Exercises Other Exercises: Pt needed extended rest breaks before after mobilization, became very nausous and hot after ambulation, provided with comfort measures, symptoms improved Other Exercises: Pt able to utilize standard commode with BSC in place, supervision for toileting activities    General Comments        Pertinent Vitals/Pain Pain Assessment: 0-10 Pain Score: 7  Pain Descriptors / Indicators: Crying;Grimacing;Moaning;Guarding;Aching;Sharp Pain Intervention(s): Limited activity within patient's tolerance;Patient requesting pain meds-RN notified;Monitored during session;Premedicated before session;Repositioned;Ice applied    Home Living  Prior Function            PT Goals (current goals can now be found in the care plan section) Progress towards PT goals: Progressing toward goals    Frequency    BID      PT Plan Current plan remains appropriate    Co-evaluation              AM-PAC PT "6 Clicks" Mobility   Outcome Measure  Help needed turning from your back to your side while in a flat bed without using bedrails?: A Lot Help  needed moving from lying on your back to sitting on the side of a flat bed without using bedrails?: A Lot Help needed moving to and from a bed to a chair (including a wheelchair)?: A Lot Help needed standing up from a chair using your arms (e.g., wheelchair or bedside chair)?: A Little Help needed to walk in hospital room?: A Little Help needed climbing 3-5 steps with a railing? : Total 6 Click Score: 13    End of Session Equipment Utilized During Treatment: Gait belt Activity Tolerance: Patient limited by pain Patient left: in chair;with chair alarm set;with call bell/phone within reach;with SCD's reapplied Nurse Communication: Mobility status PT Visit Diagnosis: Other abnormalities of gait and mobility (R26.89);Muscle weakness (generalized) (M62.81);Difficulty in walking, not elsewhere classified (R26.2);Pain Pain - Right/Left: Right Pain - part of body: Hip     Time: 6378-5885 PT Time Calculation (min) (ACUTE ONLY): 40 min  Charges:  $Gait Training: 8-22 mins $Therapeutic Exercise: 8-22 mins $Therapeutic Activity: 8-22 mins                     Olga Coaster PT, DPT 10:59 AM,09/27/19 [phone]

## 2019-09-27 NOTE — Anesthesia Postprocedure Evaluation (Signed)
Anesthesia Post Note  Patient: Laura Mckay  Procedure(s) Performed: TOTAL HIP ARTHROPLASTY ANTERIOR APPROACH (Right Hip)  Patient location during evaluation: Nursing Unit Anesthesia Type: Spinal Level of consciousness: oriented and awake and alert Pain management: pain level controlled Vital Signs Assessment: post-procedure vital signs reviewed and stable Respiratory status: spontaneous breathing and respiratory function stable Cardiovascular status: blood pressure returned to baseline and stable Postop Assessment: no headache, no backache, no apparent nausea or vomiting and patient able to bend at knees Anesthetic complications: no     Last Vitals:  Vitals:   09/27/19 0155 09/27/19 0403  BP: (!) 149/78 132/73  Pulse: 89 84  Resp:  19  Temp:  37.3 C  SpO2: 100% 98%    Last Pain:  Vitals:   09/27/19 0403  TempSrc: Oral  PainSc:                  Jerrye Noble

## 2019-09-27 NOTE — Progress Notes (Signed)
   Subjective: 1 Day Post-Op Procedure(s) (LRB): TOTAL HIP ARTHROPLASTY ANTERIOR APPROACH (Right) Patient reports pain as moderate.   Patient is well, and has had no acute complaints or problems Denies any CP, SOB, ABD pain. We will continue therapy today.  Plan is to go Home after hospital stay.  Objective: Vital signs in last 24 hours: Temp:  [97.4 F (36.3 C)-99.3 F (37.4 C)] 99.1 F (37.3 C) (10/02 0403) Pulse Rate:  [62-89] 84 (10/02 0403) Resp:  [13-25] 19 (10/02 0403) BP: (99-157)/(53-126) 132/73 (10/02 0403) SpO2:  [96 %-100 %] 98 % (10/02 0403)  Intake/Output from previous day: 10/01 0701 - 10/02 0700 In: 1653 [P.O.:720; I.V.:883; IV Piggyback:50] Out: 2120 [GQQPY:1950; Blood:200] Intake/Output this shift: No intake/output data recorded.  Recent Labs    09/26/19 1054 09/27/19 0434  HGB 12.1 11.5*   Recent Labs    09/26/19 1054 09/27/19 0434  WBC 7.5 7.3  RBC 3.94 3.78*  HCT 37.3 35.2*  PLT 255 219   Recent Labs    09/26/19 1054 09/27/19 0434  NA  --  135  K  --  4.0  CL  --  105  CO2  --  23  BUN  --  10  CREATININE 0.86 0.70  GLUCOSE  --  126*  CALCIUM  --  8.4*   No results for input(s): LABPT, INR in the last 72 hours.  EXAM General - Patient is Alert, Appropriate and Oriented Extremity - Neurovascular intact Sensation intact distally Intact pulses distally Dorsiflexion/Plantar flexion intact Dressing - dressing C/D/I and no drainage, provena intact with out drainage Motor Function - intact, moving foot and toes well on exam.    Past Medical History:  Diagnosis Date  . Arthritis    hips  . Complication of anesthesia   . Hx of degenerative disc disease   . Hypertension   . PONV (postoperative nausea and vomiting)     Assessment/Plan:   1 Day Post-Op Procedure(s) (LRB): TOTAL HIP ARTHROPLASTY ANTERIOR APPROACH (Right) Active Problems:   Status post total hip replacement, right  Estimated body mass index is 26.01 kg/m as  calculated from the following:   Height as of this encounter: 5\' 5"  (1.651 m).   Weight as of this encounter: 70.9 kg. Advance diet Up with therapy  Needs BM Labs and VS stable Recheck labs in the am Pain well controlled CM to assist with discharge to home with HHPT  DVT Prophylaxis - Lovenox, TED hose and SCDs Weight-Bearing as tolerated to right leg   T. Rachelle Hora, PA-C [place] 09/27/2019, 8:03 AM

## 2019-09-27 NOTE — Progress Notes (Signed)
Physical Therapy Treatment Patient Details Name: Laura Mckay MRN: [id number] DOB: [birth year] Today's Date: 09/27/2019    History of Present Illness Patient is a 63 year old female s/p Right THA, direct anterior approach. Patient is WBAT. PMH to include OA, HTN, DDD and recent L THA.    PT Comments    Patient up in bed, just finished lunch, reported minimal R hip pain at rest. The patient was able to significantly increased ambulation distance, ~161ft with RW and CGA. Patient exhibited decrease weight bearing of RLE, decreased gait velocity, and needed constant encouragement to maximize patient participation. The patient did vomit around ~178ft, comfort measures provided. The patient was willing to finish ambulating around the nurses station. Pt requested to utilize commode, OT entered room and agreed to assist patient to recliner. Overall the patient demonstrated good progression towards goals this session, will need stair training in the AM.     Follow Up Recommendations  Home health PT;Supervision/Assistance - 24 hour     Equipment Recommendations  None recommended by PT;Other (comment)    Recommendations for Other Services       Precautions / Restrictions Precautions Precautions: Anterior Hip Restrictions Weight Bearing Restrictions: Yes RLE Weight Bearing: Weight bearing as tolerated    Mobility  Bed Mobility Overal bed mobility: Needs Assistance Bed Mobility: Supine to Sit     Supine to sit: Min assist     General bed mobility comments: assistance provided for RLE management  Transfers Overall transfer level: Needs assistance Equipment used: Rolling walker (2 wheeled) Transfers: Sit to/from Stand Sit to Stand: Min guard            Ambulation/Gait Ambulation/Gait assistance: Min guard Gait Distance (Feet): 170 Feet Assistive device: Rolling walker (2 wheeled)   Gait velocity: decreased   General Gait Details: Pt with step to gait pattern  (instructed by PT) difficulty clearing RLE from floor, very slow ambulation. Pt endorsed increased pain with ambulation but significantly improved ambulation   Stairs             Wheelchair Mobility    Modified Rankin (Stroke Patients Only)       Balance Overall balance assessment: Needs assistance Sitting-balance support: Feet supported Sitting balance-Leahy Scale: Fair       Standing balance-Leahy Scale: Fair Standing balance comment: Able to stand and use hand sanitizer with both hands                            Cognition Arousal/Alertness: Awake/alert Behavior During Therapy: WFL for tasks assessed/performed Overall Cognitive Status: Within Functional Limits for tasks assessed                                        Exercises Other Exercises Other Exercises: Pt vomited while attempting to walk the loop. comfort measures provided, and then patient motivated to continue ambulation Other Exercises: Pt able to utilize standard commode with BSC in place, supervision    General Comments        Pertinent Vitals/Pain Pain Assessment: Faces Pain Score: 7  Faces Pain Scale: Hurts little more Pain Location: with ambulation Pain Descriptors / Indicators: Crying;Grimacing;Moaning;Guarding;Aching;Sharp Pain Intervention(s): Limited activity within patient's tolerance;Monitored during session;Premedicated before session;Repositioned    Home Living  Prior Function            PT Goals (current goals can now be found in the care plan section) Acute Rehab PT Goals Patient Stated Goal: to go home PT Goal Formulation: With patient Time For Goal Achievement: 10/10/19 Potential to Achieve Goals: Good Progress towards PT goals: Progressing toward goals    Frequency    BID      PT Plan Current plan remains appropriate    Co-evaluation              AM-PAC PT "6 Clicks" Mobility   Outcome Measure   Help needed turning from your back to your side while in a flat bed without using bedrails?: A Lot Help needed moving from lying on your back to sitting on the side of a flat bed without using bedrails?: A Lot Help needed moving to and from a bed to a chair (including a wheelchair)?: A Little Help needed standing up from a chair using your arms (e.g., wheelchair or bedside chair)?: A Little Help needed to walk in hospital room?: A Little Help needed climbing 3-5 steps with a railing? : A Lot 6 Click Score: 15    End of Session Equipment Utilized During Treatment: Gait belt Activity Tolerance: Patient tolerated treatment well Patient left: (sitting on commode with OT) Nurse Communication: Mobility status PT Visit Diagnosis: Other abnormalities of gait and mobility (R26.89);Muscle weakness (generalized) (M62.81);Difficulty in walking, not elsewhere classified (R26.2);Pain Pain - Right/Left: Right Pain - part of body: Hip     Time: 1322-1405 PT Time Calculation (min) (ACUTE ONLY): 43 min  Charges:  $Gait Training: 8-22 mins $Therapeutic Exercise: 23-37 mins $Therapeutic Activity: 8-22 mins                     Olga Coaster PT, DPT 2:42 PM,09/27/19 [phone]

## 2019-09-27 NOTE — Discharge Instructions (Signed)
ANTERIOR APPROACH TOTAL HIP REPLACEMENT POSTOPERATIVE DIRECTIONS   Hip Rehabilitation, Guidelines Following Surgery  The results of a hip operation are greatly improved after range of motion and muscle strengthening exercises. Follow all safety measures which are given to protect your hip. If any of these exercises cause increased pain or swelling in your joint, decrease the amount until you are comfortable again. Then slowly increase the exercises. Call your caregiver if you have problems or questions.   HOME CARE INSTRUCTIONS  Remove items at home which could result in a fall. This includes throw rugs or furniture in walking pathways.   ICE to the affected hip every three hours for 30 minutes at a time and then as needed for pain and swelling.  Continue to use ice on the hip for pain and swelling from surgery. You may notice swelling that will progress down to the foot and ankle.  This is normal after surgery.  Elevate the leg when you are not up walking on it.    Continue to use the breathing machine which will help keep your temperature down.  It is common for your temperature to cycle up and down following surgery, especially at night when you are not up moving around and exerting yourself.  The breathing machine keeps your lungs expanded and your temperature down.  Do not place pillow under knee, focus on keeping the knee straight while resting  DIET You may resume your previous home diet once your are discharged from the hospital.  DRESSING / WOUND CARE / SHOWERING Please remove provena negative pressure dressing on 10/05/19 and apply honey comb dressing. Keep dressing clean and dry at all times.   ACTIVITY Walk with your walker as instructed. Use walker as long as suggested by your caregivers. Avoid periods of inactivity such as sitting longer than an hour when not asleep. This helps prevent blood clots.  You may resume a sexual relationship in one month or when given the OK by  your doctor.  You may return to work once you are cleared by your doctor.  Do not drive a car for 6 weeks or until released by you surgeon.  Do not drive while taking narcotics.  WEIGHT BEARING Weight bearing as tolerated. Use walker/cane as needed for at least 4 weeks post op.  POSTOPERATIVE CONSTIPATION PROTOCOL Constipation - defined medically as fewer than three stools per week and severe constipation as less than one stool per week.  One of the most common issues patients have following surgery is constipation.  Even if you have a regular bowel pattern at home, your normal regimen is likely to be disrupted due to multiple reasons following surgery.  Combination of anesthesia, postoperative narcotics, change in appetite and fluid intake all can affect your bowels.  In order to avoid complications following surgery, here are some recommendations in order to help you during your recovery period.  Colace (docusate) - Pick up an over-the-counter form of Colace or another stool softener and take twice a day as long as you are requiring postoperative pain medications.  Take with a full glass of water daily.  If you experience loose stools or diarrhea, hold the colace until you stool forms back up.  If your symptoms do not get better within 1 week or if they get worse, check with your doctor.  Dulcolax (bisacodyl) - Pick up over-the-counter and take as directed by the product packaging as needed to assist with the movement of your bowels.  Take with a  full glass of water.  Use this product as needed if not relieved by Colace only.  ° °MiraLax (polyethylene glycol) - Pick up over-the-counter to have on hand.  MiraLax is a solution that will increase the amount of water in your bowels to assist with bowel movements.  Take as directed and can mix with a glass of water, juice, soda, coffee, or tea.  Take if you go more than two days without a movement. °Do not use MiraLax more than once per day. Call your  doctor if you are still constipated or irregular after using this medication for 7 days in a row. ° °If you continue to have problems with postoperative constipation, please contact the office for further assistance and recommendations.  If you experience "the worst abdominal pain ever" or develop nausea or vomiting, please contact the office immediatly for further recommendations for treatment. ° °ITCHING ° If you experience itching with your medications, try taking only a single pain pill, or even half a pain pill at a time.  You can also use Benadryl over the counter for itching or also to help with sleep.  ° °TED HOSE STOCKINGS °Wear the elastic stockings on both legs for six weeks following surgery during the day but you may remove then at night for sleeping. ° °MEDICATIONS °See your medication summary on the “After Visit Summary” that the nursing staff will review with you prior to discharge.  You may have some home medications which will be placed on hold until you complete the course of blood thinner medication.  It is important for you to complete the blood thinner medication as prescribed by your surgeon.  Continue your approved medications as instructed at time of discharge. ° °PRECAUTIONS °If you experience chest pain or shortness of breath - call 911 immediately for transfer to the hospital emergency department.  °If you develop a fever greater that 101 F, purulent drainage from wound, increased redness or drainage from wound, foul odor from the wound/dressing, or calf pain - CONTACT YOUR SURGEON.   °                                                °FOLLOW-UP APPOINTMENTS °Make sure you keep all of your appointments after your operation with your surgeon and caregivers. You should call the office at the above phone number and make an appointment for approximately two weeks after the date of your surgery or on the date instructed by your surgeon outlined in the "After Visit Summary". ° °RANGE OF MOTION  AND STRENGTHENING EXERCISES  °These exercises are designed to help you keep full movement of your hip joint. Follow your caregiver's or physical therapist's instructions. Perform all exercises about fifteen times, three times per day or as directed. Exercise both hips, even if you have had only one joint replacement. These exercises can be done on a training (exercise) mat, on the floor, on a table or on a bed. Use whatever works the best and is most comfortable for you. Use music or television while you are exercising so that the exercises are a pleasant break in your day. This will make your life better with the exercises acting as a break in routine you can look forward to.  °Lying on your back, slowly slide your foot toward your buttocks, raising your knee up off the floor. Then   slowly slide your foot back down until your leg is straight again.  °Lying on your back spread your legs as far apart as you can without causing discomfort.  °Lying on your side, raise your upper leg and foot straight up from the floor as far as is comfortable. Slowly lower the leg and repeat.  °Lying on your back, tighten up the muscle in the front of your thigh (quadriceps muscles). You can do this by keeping your leg straight and trying to raise your heel off the floor. This helps strengthen the largest muscle supporting your knee.  °Lying on your back, tighten up the muscles of your buttocks both with the legs straight and with the knee bent at a comfortable angle while keeping your heel on the floor.  ° °IF YOU ARE TRANSFERRED TO A SKILLED REHAB FACILITY °If the patient is transferred to a skilled rehab facility following release from the hospital, a list of the current medications will be sent to the facility for the patient to continue.  When discharged from the skilled rehab facility, please have the facility set up the patient's Home Health Physical Therapy prior to being released. Also, the skilled facility will be responsible  for providing the patient with their medications at time of release from the facility to include their pain medication, the muscle relaxants, and their blood thinner medication. If the patient is still at the rehab facility at time of the two week follow up appointment, the skilled rehab facility will also need to assist the patient in arranging follow up appointment in our office and any transportation needs. ° °MAKE SURE YOU:  °Understand these instructions.  °Get help right away if you are not doing well or get worse.  ° ° °Pick up stool softner and laxative for home use following surgery while on pain medications. °Continue to use ice for pain and swelling after surgery. °Do not use any lotions or creams on the incision until instructed by your surgeon. ° °

## 2019-09-27 NOTE — Evaluation (Signed)
Occupational Therapy Evaluation Patient Details Name: Laura Mckay MRN: [id number] DOB: 1956-04-14 Today's Date: 09/27/2019    History of Present Illness Patient is a 63 year old female s/p Right THA, direct anterior approach. Patient is WBAT. PMH to include OA, HTN, DDD and recent L THA.   Clinical Impression   Pt is 63 year old female s/p R THA(anterior) who lives at home with her husband. Pt was independent in all ADLs prior to surgery and was not using any AD for dressing after L THA 3 months ago.  She is eager to return to PLOF.  Pt is currently limited in functional ADLs due to pain, decreased ROM, nausea and vomiting.  Pt was in bathroom with PT upon OTs arrival and had recently vomited and continued to be nauseated throughout session.  She requires min assist for LB dressing and bathing skills due to pain and decreased AROM of R LE and would benefit from continued skilled OT services but has declined any further OT due to being educated last admission for L THA.  Patient seen for OT evaluation only.  Please contact OT if any needs arise before DC home.         Follow Up Recommendations  No OT follow up    Equipment Recommendations       Recommendations for Other Services       Precautions / Restrictions Precautions Precautions: Anterior Hip Restrictions Weight Bearing Restrictions: Yes RLE Weight Bearing: Weight bearing as tolerated      Mobility Bed Mobility Overal bed mobility: Needs Assistance Bed Mobility: Supine to Sit     Supine to sit: Min assist     General bed mobility comments: assistance provided for RLE management  Transfers Overall transfer level: Needs assistance Equipment used: Rolling walker (2 wheeled) Transfers: Sit to/from Stand Sit to Stand: Min guard              Balance Overall balance assessment: Needs assistance Sitting-balance support: Feet supported Sitting balance-Leahy Scale: Fair       Standing balance-Leahy Scale:  Fair Standing balance comment: Able to stand and use hand sanitizer with both hands                           ADL either performed or assessed with clinical judgement   ADL Overall ADL's : (see general ADL comments)                                       General ADL Comments: Pt requires assist for LB dressing due to pain and nausea but is fully educated and aware of AD for dressing from her last hip surgery 3 months ago and declined any further ed or training.  Reviewed proper body mechanics for her husband when helping with LB dressing skills. They have a walk in shower with a built in seat, grab bars and a BSC over toilet at home.  He husband was present and agreed with plan to continue with PT only.     Vision Patient Visual Report: No change from baseline       Perception     Praxis      Pertinent Vitals/Pain Pain Assessment: Faces Faces Pain Scale: Hurts little more Pain Location: with ambulation and transfers Pain Descriptors / Indicators: Crying;Grimacing;Moaning;Guarding;Aching;Sharp Pain Intervention(s): Limited activity within patient's tolerance;Monitored during session;Premedicated before session;Repositioned  Hand Dominance Right   Extremity/Trunk Assessment Upper Extremity Assessment Upper Extremity Assessment: Overall WFL for tasks assessed   Lower Extremity Assessment Lower Extremity Assessment: Defer to PT evaluation   Cervical / Trunk Assessment Cervical / Trunk Assessment: Normal   Communication Communication Communication: No difficulties   Cognition Arousal/Alertness: Awake/alert Behavior During Therapy: WFL for tasks assessed/performed Overall Cognitive Status: Within Functional Limits for tasks assessed                                     General Comments       Exercises Other Exercises Other Exercises: Pt vomited while attempting to walk the loop. comfort measures provided, and then patient  motivated to continue ambulation Other Exercises: Pt able to utilize standard commode with BSC in place, supervision   Shoulder Instructions      Home Living Family/patient expects to be discharged to:: Private residence Living Arrangements: Spouse/significant other Available Help at Discharge: Family Type of Home: House Home Access: Stairs to enter Secretary/administrator of Steps: 16 Entrance Stairs-Rails: Can reach both;Right;Left Home Layout: One level     Bathroom Shower/Tub: Producer, television/film/video: Standard     Home Equipment: Environmental consultant - 2 wheels;Shower seat;Bedside commode;Hand held shower head;Shower seat - built in          Prior Functioning/Environment Level of Independence: Independent                 OT Problem List: Pain;Decreased activity tolerance      OT Treatment/Interventions:      OT Goals(Current goals can be found in the care plan section) Acute Rehab OT Goals Patient Stated Goal: to go home OT Goal Formulation: With patient/family Time For Goal Achievement: 09/27/19 Potential to Achieve Goals: Good  OT Frequency:     Barriers to D/C:            Co-evaluation              AM-PAC OT "6 Clicks" Daily Activity     Outcome Measure Help from another person eating meals?: None Help from another person taking care of personal grooming?: None Help from another person toileting, which includes using toliet, bedpan, or urinal?: A Little Help from another person bathing (including washing, rinsing, drying)?: A Little Help from another person to put on and taking off regular upper body clothing?: None Help from another person to put on and taking off regular lower body clothing?: A Little 6 Click Score: 21   End of Session    Activity Tolerance: Patient limited by pain;Other (comment)(limited by nausea as well as pain) Patient left: in chair;with call bell/phone within reach;with family/visitor present(pt requested no chair  alarm since her husband was present---instructed to call NSG when he leaves)  OT Visit Diagnosis: Pain;Other abnormalities of gait and mobility (R26.89) Pain - Right/Left: Right Pain - part of body: Hip                Time: 7425-9563 OT Time Calculation (min): 30 min Charges:  OT General Charges $OT Visit: 1 Visit OT Evaluation $OT Eval Low Complexity: 1 Low OT Treatments $Self Care/Home Management : 8-22 mins    Susanne Borders, OTR/L, [place] [facility] [phone] 09/27/19, 2:57 PM

## 2019-09-28 NOTE — Progress Notes (Signed)
Physical Therapy Treatment Patient Details Name: Laura Mckay MRN: [id number] DOB: 06-10-1956 Today's Date: 09/28/2019    History of Present Illness Patient is a 63 year old female s/p Right THA, direct anterior approach. Patient is WBAT. PMH to include OA, HTN, DDD and recent L THA.    PT Comments    Pt appeared to be in better spirits this afternoon but still restricted by hypotension and symptoms including nausea and lightheadedness.  Pt better able to perform bed mobility and there ex seated.  She reported R hip pain with mobility but was better able to WB when standing from bedside.  Time taken to monitor vitals and ambulation deferred due to hypotension.  Pt able to complete all there ex with VC's for proper form and manual assistance to complete hip abduction.  PT took time to answer all questions regarding mobility and healing timeline and pt was open to education.  Pt's husband in room during treatment.  She will continue to benefit from skilled PT with focus on strength, pain management, tolerance to activity and safe functional mobility.  Follow Up Recommendations  Home health PT;Supervision/Assistance - 24 hour     Equipment Recommendations  None recommended by PT;Other (comment)    Recommendations for Other Services       Precautions / Restrictions Precautions Precautions: Anterior Hip Restrictions Weight Bearing Restrictions: Yes RLE Weight Bearing: Weight bearing as tolerated    Mobility  Bed Mobility Overal bed mobility: Needs Assistance Bed Mobility: Supine to Sit     Supine to sit: Min assist     General bed mobility comments: Better able to get to EOB with min A to mobilize R LE.  Transfers Overall transfer level: Needs assistance Equipment used: Rolling walker (2 wheeled) Transfers: Sit to/from [facility] Sit to Stand: Min assist         General transfer comment: Able to stand from bedside with use of UE's to elevate and  gradual wt shift to R side.  VC's for use of RW. Transfer to chair only performed due to pt being hypotensive and symptomatic still.  Ambulation/Gait         Gait velocity: decreased       Stairs             Wheelchair Mobility    Modified Rankin (Stroke Patients Only)       Balance Overall balance assessment: Needs assistance Sitting-balance support: Feet supported Sitting balance-Leahy Scale: Good       Standing balance-Leahy Scale: Good                              Cognition Arousal/Alertness: Awake/alert Behavior During Therapy: WFL for tasks assessed/performed Overall Cognitive Status: Within Functional Limits for tasks assessed                                 General Comments: Appears to be in better spirits this afternoon.      Exercises Total Joint Exercises Quad Sets: Both;15 reps;Seated;Strengthening Gluteal Sets: Strengthening;Both;15 reps;Seated Towel Squeeze: Strengthening;Both;10 reps;Seated Short Arc Quad: Right;Strengthening;10 reps;Seated Hip ABduction/ADduction: Right;Supine;10 reps;AAROM Long Arc Quad: Right;Strengthening;10 reps;Seated Other Exercises Other Exercises: Time to monitor vitals x4 min    General Comments        Pertinent Vitals/Pain Pain Assessment: Faces Faces Pain Scale: Hurts little more Pain Location: R hip with mobility  Home Living                      Prior Function            PT Goals (current goals can now be found in the care plan section) Acute Rehab PT Goals Patient Stated Goal: to go home PT Goal Formulation: With patient Time For Goal Achievement: 10/10/19 Potential to Achieve Goals: Good Progress towards PT goals: Progressing toward goals    Frequency    BID      PT Plan Current plan remains appropriate    Co-evaluation              AM-PAC PT "6 Clicks" Mobility   Outcome Measure  Help needed turning from your back to your side while  in a flat bed without using bedrails?: A Little Help needed moving from lying on your back to sitting on the side of a flat bed without using bedrails?: A Little Help needed moving to and from a bed to a chair (including a wheelchair)?: A Little Help needed standing up from a chair using your arms (e.g., wheelchair or bedside chair)?: A Little Help needed to walk in hospital room?: A Little Help needed climbing 3-5 steps with a railing? : A Lot 6 Click Score: 17    End of Session Equipment Utilized During Treatment: Gait belt Activity Tolerance: Other (comment)(Nausea, hypotension and hip pain.) Patient left: with call bell/phone within reach;with bed alarm set;in chair;with family/visitor present(sitting on commode with OT) Nurse Communication: Mobility status PT Visit Diagnosis: Other abnormalities of gait and mobility (R26.89);Muscle weakness (generalized) (M62.81);Difficulty in walking, not elsewhere classified (R26.2);Pain Pain - Right/Left: Right Pain - part of body: Hip     Time: 8546-2703 PT Time Calculation (min) (ACUTE ONLY): 23 min  Charges:  $Therapeutic Exercise: 23-37 mins                    Glenetta Hew, PT, DPT    Glenetta Hew 09/28/2019, 4:38 PM

## 2019-09-28 NOTE — Progress Notes (Signed)
PT Cancellation Note  Patient Details Name: Laura Mckay MRN: [id number] DOB: [birth year]   Cancelled Treatment:    Reason Eval/Treat Not Completed: Patient at procedure or test/unavailable.  Pt currently on BSC.  Will return shortly.  Roxanne Gates, PT, DPT  Roxanne Gates 09/28/2019, 11:49 AM

## 2019-09-28 NOTE — Progress Notes (Signed)
Patient still needs post op BM, and would rather wait until tomorrow to discharge. Dr Roland Rack paged and is ok with staying until tomorrow.

## 2019-09-28 NOTE — Progress Notes (Signed)
Physical Therapy Treatment Patient Details Name: Laura Mckay MRN: [id number] DOB: Sep 10, 1956 Today's Date: 09/28/2019    History of Present Illness Patient is a 63 year old female s/p Right THA, direct anterior approach. Patient is WBAT. PMH to include OA, HTN, DDD and recent L THA.    PT Comments    Pt sitting on toilet riser in restroom when PT arrived.  Pt alert and reporting mild anxiety due to R hip pain, nausea and feeling "dizzy".  Pt hypotensive when vitals checked and required min A to stand from toilet.  Pt able to manage RW to walk to bed with close CGA and PT guiding pt through breathing exercises.  Pt focused on gait and PT advised pt accordingly to get to bed safely.  Pt assisted to bed and able to scoot up in bed independently.  Pt placed in trendelenburg position and advised to continue breathing exercises for management of anxiety and pain.  Pt able to calm down after 4-5 min.  Emesis bag placed by pt's side and PT discussed plan for stair training later this morning.  Pt agreeable.  Pt will continue to benefit from skilled PT with focus on pain management, gait, safe functional mobility and stair negotiation.  Follow Up Recommendations  Home health PT;Supervision/Assistance - 24 hour     Equipment Recommendations  None recommended by PT;Other (comment)    Recommendations for Other Services       Precautions / Restrictions Precautions Precautions: Anterior Hip Restrictions Weight Bearing Restrictions: Yes RLE Weight Bearing: Weight bearing as tolerated    Mobility  Bed Mobility Overal bed mobility: Needs Assistance Bed Mobility: Supine to Sit;Sit to Supine       Sit to supine: Mod assist   General bed mobility comments: Assistance with LE's over EOB; able to scott up in bed independently.  Pt placed in trendelenberg with rolled blanket under feet.  Transfers Overall transfer level: Needs assistance Equipment used: Rolling walker (2  wheeled) Transfers: Sit to/from Stand Sit to Stand: Min assist         General transfer comment: Pt on toilet riser in restroom when PT entered.  Pt easily upset and reporting hip pain, nausea and feeling "dizzy".  Min A to rise from toilet and pt able to use RW to stand upright.  Ambulation/Gait Ambulation/Gait assistance: Min guard   Assistive device: Rolling walker (2 wheeled)   Gait velocity: decreased Gait velocity interpretation: <1.8 ft/sec, indicate of risk for recurrent falls General Gait Details: Pt very aware of step pattern and PT educated on appropriate step pattern but PT pt to focus on breathing for relaxation and getting to bedside.  Pt able to manage RW and sit on EOB without assistance.   Stairs             Wheelchair Mobility    Modified Rankin (Stroke Patients Only)       Balance Overall balance assessment: Needs assistance Sitting-balance support: Feet supported Sitting balance-Leahy Scale: Fair       Standing balance-Leahy Scale: Fair                              Cognition Arousal/Alertness: Awake/alert Behavior During Therapy: WFL for tasks assessed/performed;Restless Overall Cognitive Status: Within Functional Limits for tasks assessed  General Comments: Pt very apologetic for not being able to participate.  Able to relax some after breathing for relaxation guidance.      Exercises Other Exercises Other Exercises: Time to monitor vitals x6 min Other Exercises: Directed pt through breathing for relaxation for pain management and management of anxiety.  Pt calmer following 5 min of this and positioning to manage BP.    General Comments        Pertinent Vitals/Pain Pain Assessment: Faces Faces Pain Scale: Hurts even more Pain Location: R hip Pain Descriptors / Indicators: Grimacing;Guarding;Moaning Pain Intervention(s): Limited activity within patient's tolerance;Monitored  during session;Premedicated before session    Home Living                      Prior Function            PT Goals (current goals can now be found in the care plan section) Acute Rehab PT Goals Patient Stated Goal: to go home PT Goal Formulation: With patient Time For Goal Achievement: 10/10/19 Potential to Achieve Goals: Good Progress towards PT goals: PT to reassess next treatment    Frequency    BID      PT Plan Current plan remains appropriate    Co-evaluation              AM-PAC PT "6 Clicks" Mobility   Outcome Measure  Help needed turning from your back to your side while in a flat bed without using bedrails?: A Little Help needed moving from lying on your back to sitting on the side of a flat bed without using bedrails?: A Little Help needed moving to and from a bed to a chair (including a wheelchair)?: A Little Help needed standing up from a chair using your arms (e.g., wheelchair or bedside chair)?: A Little Help needed to walk in hospital room?: A Little Help needed climbing 3-5 steps with a railing? : A Lot 6 Click Score: 17    End of Session Equipment Utilized During Treatment: Gait belt Activity Tolerance: Other (comment)(Nausea, hypotension and hip pain.) Patient left: in bed;with nursing/sitter in room;with call bell/phone within reach;with bed alarm set(sitting on commode with OT) Nurse Communication: Mobility status PT Visit Diagnosis: Other abnormalities of gait and mobility (R26.89);Muscle weakness (generalized) (M62.81);Difficulty in walking, not elsewhere classified (R26.2);Pain Pain - Right/Left: Right Pain - part of body: Hip     Time: 0942-1000 PT Time Calculation (min) (ACUTE ONLY): 18 min  Charges:  $Therapeutic Activity: 8-22 mins                     Roxanne Gates, PT, DPT    Roxanne Gates 09/28/2019, 10:17 AM

## 2019-09-28 NOTE — Progress Notes (Signed)
   Subjective: 2 Days Post-Op Procedure(s) (LRB): TOTAL HIP ARTHROPLASTY ANTERIOR APPROACH (Right) Patient reports pain as mild to moderate.   Patient is well, and has had no acute complaints or problems Denies any CP, SOB, ABD pain. We will continue therapy today.  Plan is to go Home after hospital stay.  Objective: Vital signs in last 24 hours: Temp:  [98.7 F (37.1 C)-98.9 F (37.2 C)] 98.9 F (37.2 C) (10/03 0014) Pulse Rate:  [80-96] 80 (10/03 0014) Resp:  [16-19] 19 (10/03 0014) BP: (101-154)/(61-75) 101/61 (10/03 0014) SpO2:  [99 %-100 %] 99 % (10/03 0014)  Intake/Output from previous day: 10/02 0701 - 10/03 0700 In: 420 [P.O.:420] Out: -  Intake/Output this shift: No intake/output data recorded.  Recent Labs    09/26/19 1054 09/27/19 0434  HGB 12.1 11.5*   Recent Labs    09/26/19 1054 09/27/19 0434  WBC 7.5 7.3  RBC 3.94 3.78*  HCT 37.3 35.2*  PLT 255 219   Recent Labs    09/26/19 1054 09/27/19 0434  NA  --  135  K  --  4.0  CL  --  105  CO2  --  23  BUN  --  10  CREATININE 0.86 0.70  GLUCOSE  --  126*  CALCIUM  --  8.4*   No results for input(s): LABPT, INR in the last 72 hours.  EXAM General - Patient is Alert, Appropriate and Oriented Extremity - Neurovascular intact Sensation intact distally Intact pulses distally Dorsiflexion/Plantar flexion intact Dressing - dressing C/D/I and no drainage, provena intact with out drainage Motor Function - intact, moving foot and toes well on exam.    Past Medical History:  Diagnosis Date  . Arthritis    hips  . Complication of anesthesia   . Hx of degenerative disc disease   . Hypertension   . PONV (postoperative nausea and vomiting)     Assessment/Plan:   2 Days Post-Op Procedure(s) (LRB): TOTAL HIP ARTHROPLASTY ANTERIOR APPROACH (Right) Active Problems:   Status post total hip replacement, right  Estimated body mass index is 26.01 kg/m as calculated from the following:   Height as  of this encounter: 5\' 5"  (1.651 m).   Weight as of this encounter: 70.9 kg. Advance diet Up with therapy  Needs BM Labs and VS stable Pain well controlled CM to assist with discharge to home with HHPT today   DVT Prophylaxis - Lovenox, TED hose and SCDs Weight-Bearing as tolerated to right leg   Reche Dixon PA-C [place] 09/28/2019, 6:50 AM

## 2019-09-28 NOTE — TOC Transition Note (Signed)
Transition of Care [facility]) - CM/SW Discharge Note   Patient Details  Name: Dinita Migliaccio MRN: [id number] Date of Birth: 1956/12/15  [facility] CM/SW Contact:  Latanya Maudlin, RN Phone Number: 09/28/2019, 8:45 AM   Clinical Narrative: Patient to be discharged per MD order. Orders in place for home health services. Previous TOC team member had set up home health through [facility]. Notified [name] at [facility] of discharge. Patient has all needed DME at home. Spouse to transport.        Final next level of care: [place] Barriers to Discharge: No Barriers Identified   Patient Goals and CMS Choice Patient states their goals for this hospitalization and ongoing recovery are:: To go home. CMS Medicare.gov Compare Post Acute Care list provided to:: Patient Choice offered to / list presented to : Patient  Discharge Placement                       Discharge Plan and Services In-house Referral: Clinical Social Work   Post Acute Care Choice: Home Health          DME Arranged: N/A         HH Arranged: PT [place] Agency: [facility] (formerly [employer]) Date [place]: 09/28/19 Time [place]: 0845 Representative spoke with at [place]: [place] ([place]) Interventions     Readmission Risk Interventions Readmission Risk Prevention Plan 09/28/2019  Post Dischage Appt Complete  Medication Screening Complete  Transportation Screening Complete  Some recent data might be hidden

## 2019-09-28 NOTE — Plan of Care (Signed)

## 2019-09-29 MED ORDER — FLEET ENEMA 7-19 GM/118ML RE ENEM
1.0000 | ENEMA | Freq: Once | RECTAL | Status: AC
  Administered 2019-09-29: 1 via RECTAL

## 2019-09-29 NOTE — Progress Notes (Signed)
Patient discharging home. Educated on prevena use, prescriptions, and post op care, verbalized understanding. Iv removed. Patient's husband is on the way to come transport patient home.

## 2019-09-29 NOTE — Progress Notes (Signed)
PT Cancellation Note  Patient Details Name: Laura Mckay MRN: [id number] DOB: August 25, 1956   Cancelled Treatment:    Reason Eval/Treat Not Completed: Other (comment)(patient just got breakfast and would like to eat. Will attempt again later this morning.)   Everlean Alstrom. Graylon Good, PT, DPT 09/29/19, 8:39 AM

## 2019-09-29 NOTE — Progress Notes (Signed)
   Subjective: 3 Days Post-Op Procedure(s) (LRB): TOTAL HIP ARTHROPLASTY ANTERIOR APPROACH (Right) Patient reports pain as mild to moderate.  Improving. Patient is well, and has had no acute complaints or problems Denies any CP, SOB, ABD pain. We will continue therapy today.  Plan is to go Home after hospital stay.  Plan for today.  Objective: Vital signs in last 24 hours: Temp:  [99.3 F (37.4 C)-100.7 F (38.2 C)] 100.7 F (38.2 C) (10/03 2331) Pulse Rate:  [77-101] 77 (10/03 2331) Resp:  [16] 16 (10/03 2331) BP: (96-157)/(62-85) 115/68 (10/03 2331) SpO2:  [95 %-100 %] 100 % (10/03 2331)  Intake/Output from previous day: 10/03 0701 - 10/04 0700 In: 1742.5 [I.V.:1742.5] Out: -  Intake/Output this shift: No intake/output data recorded.  Recent Labs    09/26/19 1054 09/27/19 0434  HGB 12.1 11.5*   Recent Labs    09/26/19 1054 09/27/19 0434  WBC 7.5 7.3  RBC 3.94 3.78*  HCT 37.3 35.2*  PLT 255 219   Recent Labs    09/26/19 1054 09/27/19 0434  NA  --  135  K  --  4.0  CL  --  105  CO2  --  23  BUN  --  10  CREATININE 0.86 0.70  GLUCOSE  --  126*  CALCIUM  --  8.4*   No results for input(s): LABPT, INR in the last 72 hours.  EXAM General - Patient is Alert, Appropriate and Oriented Extremity - Neurovascular intact Sensation intact distally Intact pulses distally Dorsiflexion/Plantar flexion intact Dressing - dressing C/D/I and no drainage, provena intact with out drainage Motor Function - intact, moving foot and toes well on exam.    Past Medical History:  Diagnosis Date  . Arthritis    hips  . Complication of anesthesia   . Hx of degenerative disc disease   . Hypertension   . PONV (postoperative nausea and vomiting)     Assessment/Plan:   3 Days Post-Op Procedure(s) (LRB): TOTAL HIP ARTHROPLASTY ANTERIOR APPROACH (Right) Active Problems:   Status post total hip replacement, right  Estimated body mass index is 26.01 kg/m as calculated  from the following:   Height as of this encounter: 5\' 5"  (1.651 m).   Weight as of this encounter: 70.9 kg. Advance diet Up with therapy  Had bowel movement this morning. Labs and VS stable Pain well controlled CM to assist with discharge to home with HHPT today   DVT Prophylaxis - Lovenox, TED hose and SCDs Weight-Bearing as tolerated to right leg   Reche Dixon PA-C [place] 09/29/2019, 7:24 AM

## 2019-09-29 NOTE — Progress Notes (Signed)
Pt still needing a decent bowel movement. Spoke with Dr. Roland Rack may order fleets enema.

## 2019-09-29 NOTE — Progress Notes (Signed)
Physical Therapy Treatment Patient Details Name: Laura Mckay MRN: [id number] DOB: February 13, 1956 Today's Date: 09/29/2019    History of Present Illness Patient is a 63 year old female s/p Right THA, direct anterior approach. Patient is WBAT. PMH to include OA, HTN, DDD and recent L THA.    PT Comments    Patient tolerated treatment well and continues to make progress towards goals. She was feeling better today and no limited by nausea. She was somewhat anxious about controlling her bowels after getting an enema earlier today and toileting was included in her session today. Patient was able to advance to completing stairs with CGA and BUE support using handrails and step to gait pattern. Was quite anxious about descending with R LE leading but was able to complete the task with increasing ease with practice. Patient was able to ambulate further than last session, but requested to limit her distance today second to pain and fatigue and expecting further activity later today with planned discharge as soon as PT session completed. Patient would benefit from continued physical therapy to address remaining impairments and functional limitations to work towards stated goals and return to PLOF or maximal functional independence.     Follow Up Recommendations  Home health PT;Supervision/Assistance - 24 hour     Equipment Recommendations  None recommended by PT;Other (comment)    Recommendations for Other Services       Precautions / Restrictions Precautions Precautions: Anterior Hip Restrictions Weight Bearing Restrictions: Yes RLE Weight Bearing: Weight bearing as tolerated    Mobility  Bed Mobility Overal bed mobility: Needs Assistance Bed Mobility: Supine to Sit     Supine to sit: Min assist     General bed mobility comments: very minimal assistance needed to move R LE  Transfers Overall transfer level: Needs assistance Equipment used: Rolling walker (2 wheeled) Transfers:  Sit to/from Stand Sit to Stand: Supervision         General transfer comment: Patient able to perform sit <> stand x3 from/to chair/bed/elevated toilet with extra time and encouragment. Very slow and cautious but using proper mechanics  Ambulation/Gait Ambulation/Gait assistance: Min guard;Supervision Gait Distance (Feet): 80 Feet Assistive device: Rolling walker (2 wheeled) Gait Pattern/deviations: Step-to pattern;Decreased step length - left;Decreased stance time - right;Decreased dorsiflexion - right;Decreased weight shift to right Gait velocity: decreased   General Gait Details: Patient ambulated bed to toilet (~20 feet), toilet to chair (~ 20 feet) and chair to chair in hallway (~ 40 feet) with RW and CGA-SBA for safety with chair following. Patient stated she felt a bit "woozy" upon initially standing and demo good judgement waiting for it to pass prior to ambulating further. [place] to demo difficulty with weight acceptance on R LE related to pain and anxiety.   Stairs Stairs: Yes Stairs assistance: Min guard Stair Management: Two rails;Step to pattern;Forwards Number of Stairs: 4 General stair comments: Patient very fearful to attempt descent of stairs but able to ascend with good confidence. Required strong encouragment to lead with R LE on descent and pushed herself with whimpering and crying that improved with each step as patient became more familar with the pattern and gained confidence. Pain no worse following. Ascended leading with L LE. Educated on how to complete this as home.   Wheelchair Mobility    Modified Rankin (Stroke Patients Only)       Balance Overall balance assessment: Needs assistance Sitting-balance support: Feet supported Sitting balance-Leahy Scale: Good Sitting balance - Comments: needed assistance to  don underwear while seated due to difficulty reaching feet.     Standing balance-Leahy Scale: Good Standing balance comment: able to stand  and wash hands at sink and pull up underwear (with min A).                            Cognition Arousal/Alertness: Awake/alert Behavior During Therapy: WFL for tasks assessed/performed;Anxious Overall Cognitive Status: Within Functional Limits for tasks assessed                                 General Comments: anxious about stairs and apologetic for needing to use bathroom, excited to be discharging      Exercises Other Exercises Other Exercises: Patient was assisted in toileting and pericare as well as education on how to safely complete this at home. Patient also practiced donning and doffing underwear in preparation for going home which required improved seated and standing balance. Required min A due to pain and difficulty bending over.    General Comments        Pertinent Vitals/Pain Pain Assessment: 0-10 Pain Score: 5 (3-4/10 at rest 5-6/10 during stairs) Pain Location: R hip Pain Descriptors / Indicators: Crying;Grimacing;Guarding;Moaning Pain Intervention(s): Limited activity within patient's tolerance;Monitored during session;Premedicated before session;Repositioned    Home Living                      Prior Function            PT Goals (current goals can now be found in the care plan section) Acute Rehab PT Goals Patient Stated Goal: to go home PT Goal Formulation: With patient Time For Goal Achievement: 10/10/19 Potential to Achieve Goals: Good Progress towards PT goals: Progressing toward goals    Frequency    BID      PT Plan Current plan remains appropriate    Co-evaluation              AM-PAC PT "6 Clicks" Mobility   Outcome Measure  Help needed turning from your back to your side while in a flat bed without using bedrails?: A Little Help needed moving from lying on your back to sitting on the side of a flat bed without using bedrails?: A Little Help needed moving to and from a bed to a chair (including a  wheelchair)?: A Little Help needed standing up from a chair using your arms (e.g., wheelchair or bedside chair)?: A Little Help needed to walk in hospital room?: A Little Help needed climbing 3-5 steps with a railing? : A Little 6 Click Score: 18    End of Session Equipment Utilized During Treatment: Gait belt Activity Tolerance: Other (comment);Patient tolerated treatment well;Patient limited by pain;Patient limited by fatigue(hip pain, no nausea) Patient left: with call bell/phone within reach;with bed alarm set;in chair;with family/visitor present(sitting on commode with OT) Nurse Communication: Mobility status PT Visit Diagnosis: Other abnormalities of gait and mobility (R26.89);Muscle weakness (generalized) (M62.81);Difficulty in walking, not elsewhere classified (R26.2);Pain Pain - Right/Left: Right Pain - part of body: Hip     Time: 6712-4580 PT Time Calculation (min) (ACUTE ONLY): 35 min  Charges:  $Gait Training: 8-22 mins $Therapeutic Activity: 8-22 mins                    Everlean Alstrom. Graylon Good, PT, DPT 09/29/19, 11:16 AM

## 2019-10-30 ENCOUNTER — Other Ambulatory Visit: Payer: Self-pay

## 2019-10-30 ENCOUNTER — Ambulatory Visit (INDEPENDENT_AMBULATORY_CARE_PROVIDER_SITE_OTHER): Payer: No Typology Code available for payment source | Admitting: Urology

## 2019-10-30 ENCOUNTER — Encounter: Payer: Self-pay | Admitting: Urology

## 2019-10-30 VITALS — BP 138/66 | HR 72 | Ht 65.0 in | Wt 155.0 lb

## 2019-10-30 DIAGNOSIS — R8290 Unspecified abnormal findings in urine: Secondary | ICD-10-CM | POA: Diagnosis not present

## 2019-10-30 DIAGNOSIS — R351 Nocturia: Secondary | ICD-10-CM | POA: Diagnosis not present

## 2019-10-30 DIAGNOSIS — N3946 Mixed incontinence: Secondary | ICD-10-CM

## 2019-10-30 LAB — BLADDER SCAN AMB NON-IMAGING

## 2019-10-30 MED ORDER — OXYBUTYNIN CHLORIDE ER 10 MG PO TB24: 10.0000 mg | ORAL_TABLET | Freq: Every day | ORAL | 5 refills | Status: DC

## 2019-10-30 NOTE — Progress Notes (Signed)
10/30/2019 10:55 AM   Laura Mckay [birth year] [id number]  Referring provider: Marguarite Arbour, MD [address] [facility] [place],  [place] [address]  Chief Complaint  Patient presents with  . Urinary Incontinence    HPI: [age] year old female who presents today for further evaluation of urinary incontinence.  She has personal history of fairly significant and severe urge incontinence and underwent which she believes was a transurethral mesh sling in 2006 in [place].  She will use this was either a Research officer, political party or perhaps or [place] based on her discussion with the surgeon although she no longer is able to obtain these records.  She reports that initially, this helped significantly with her leakage with laughing coughing sneezing.  Over the past 1 to 2 years however, she is began to experience increasing urinary symptoms.  These include leakage of variable amounts when she changes positions or stands.  She will feel the urge to void and then trickle out a relatively small amount of urine.  She is wearing several pads a day for this.  Is bothersome to her.  She also has more frequency during the daytime and gets up several times at night to void, up to 3-4 times.  She for careful about what she drinks.  No issues with constipation.  She reports that she in fact leans on the loose frequent stool side.  She was treated for an isolated UTI this summer.  She does report today that she has been noticing change in the smell of her urination.  She is worried that this may be related to medications or supplements.  PMH: Past Medical History:  Diagnosis Date  . Arthritis    hips  . Complication of anesthesia   . Hx of degenerative disc disease   . Hypertension   . PONV (postoperative nausea and vomiting)     Surgical History: Past Surgical History:  Procedure Laterality Date  . BLADDER REPAIR    . CHOLECYSTECTOMY    . SHOULDER ARTHROSCOPY  Right 02/16/2016   Procedure: ARTHROSCOPY SHOULDER, rotator cuff repair, extensive debridement, biceps tenodesis, decompression;  Surgeon: Christena Flake, MD;  Location: [facility];  Service: Orthopedics;  Laterality: Right;  . TONSILLECTOMY    . TOTAL HIP ARTHROPLASTY Left 06/25/2019   Procedure: TOTAL HIP ARTHROPLASTY LEFT;  Surgeon: Kennedy Bucker, MD;  Location: [facility];  Service: Orthopedics;  Laterality: Left;  . TOTAL HIP ARTHROPLASTY Right 09/26/2019   Procedure: TOTAL HIP ARTHROPLASTY ANTERIOR APPROACH;  Surgeon: Kennedy Bucker, MD;  Location: [facility];  Service: Orthopedics;  Laterality: Right;    Home Medications:  Allergies as of 10/30/2019   No Known Allergies     Medication List       Accurate as of October 30, 2019 11:59 PM. If you have any questions, ask your nurse or doctor.        STOP taking these medications   enoxaparin 40 MG/0.4ML injection Commonly known as: LOVENOX Stopped by: Vanna Scotland, MD     TAKE these medications   bisoprolol-hydrochlorothiazide 5-6.25 MG tablet Commonly known as: ZIAC Take 1 tablet by mouth at bedtime.   desloratadine 5 MG tablet Commonly known as: CLARINEX Take 5 mg by mouth at bedtime.   docusate sodium 100 MG capsule Commonly known as: COLACE Take 1 capsule (100 mg total) by mouth 2 (two) times daily.   HYDROcodone-acetaminophen 7.5-325 MG tablet Commonly known as: NORCO Take 1-2 tablets by mouth every 4 (four) hours as  needed for moderate pain.   methocarbamol 500 MG tablet Commonly known as: ROBAXIN Take 1 tablet (500 mg total) by mouth every 6 (six) hours as needed for muscle spasms.   oxybutynin 10 MG 24 hr tablet Commonly known as: DITROPAN-XL Take 1 tablet (10 mg total) by mouth daily. Started by: Vanna ScotlandAshley Candace Ramus, MD   PROBIOTIC ADVANCED PO Take 1 tablet by mouth daily.   traMADol 50 MG tablet Commonly known as: ULTRAM Take 1 tablet (50 mg total) by mouth every 6 (six) hours as needed.       Allergies: No  Known Allergies  Family History: Family History  Problem Relation Age of Onset  . Breast cancer Maternal Aunt 60  . Breast cancer Paternal Aunt        pat great aunt    Social History:  reports that she has never smoked. She has never used smokeless tobacco. She reports previous alcohol use. She reports that she does not use drugs.  ROS: UROLOGY Frequent Urination?: No Hard to postpone urination?: No Burning/pain with urination?: No Get up at night to urinate?: Yes Leakage of urine?: Yes Urine stream starts and stops?: No Trouble starting stream?: No Do you have to strain to urinate?: No Blood in urine?: No Urinary tract infection?: No Sexually transmitted disease?: No Injury to kidneys or bladder?: No Painful intercourse?: No Weak stream?: Yes Currently pregnant?: No Vaginal bleeding?: No Last menstrual period?: n  Gastrointestinal Nausea?: No Vomiting?: No Indigestion/heartburn?: No Diarrhea?: No Constipation?: No  Constitutional Fever: No Night sweats?: No Weight loss?: No Fatigue?: No  Skin Skin rash/lesions?: No Itching?: No  Eyes Blurred vision?: No Double vision?: No  Ears/Nose/Throat Sore throat?: No Sinus problems?: No  Hematologic/Lymphatic Swollen glands?: No Easy bruising?: No  Cardiovascular Leg swelling?: No Chest pain?: No  Respiratory Cough?: No Shortness of breath?: No  Endocrine Excessive thirst?: No  Musculoskeletal Back pain?: No Joint pain?: No  Neurological Headaches?: No Dizziness?: No  Psychologic Depression?: No Anxiety?: No  Physical Exam: BP 138/66   Pulse 72   Ht 5\' 5"  (1.651 m)   Wt 155 lb (70.3 kg)   BMI 25.79 kg/m   Constitutional:  Alert and oriented, No acute distress. HEENT: [place] AT, moist mucus membranes.  Trachea midline, no masses. Cardiovascular: No clubbing, cyanosis, or edema. Respiratory: Normal respiratory effort, no increased work of breathing. Skin: No rashes, bruises or suspicious  lesions. Neurologic: Grossly intact, no focal deficits, moving all 4 extremities. Psychiatric: Normal mood and affect.  Laboratory Data: Lab Results  Component Value Date   WBC 7.3 09/27/2019   HGB 11.5 (L) 09/27/2019   HCT 35.2 (L) 09/27/2019   MCV 93.1 09/27/2019   PLT 219 09/27/2019    Lab Results  Component Value Date   CREATININE 0.70 09/27/2019     Urinalysis Results for orders placed or performed in visit on 10/30/19  Microscopic Examination   URINE  Result Value Ref Range   WBC, UA 0-5 0 - 5 /hpf   RBC None seen 0 - 2 /hpf   Epithelial Cells (non renal) 0-10 0 - 10 /hpf   Bacteria, UA Moderate (A) None seen/Few  Urinalysis, Complete  Result Value Ref Range   Specific Gravity, UA 1.025 1.005 - 1.030   pH, UA 5.0 5.0 - 7.5   Color, UA Yellow Yellow   Appearance Ur Hazy (A) Clear   Leukocytes,UA Trace (A) Negative   Protein,UA Negative Negative/Trace   Glucose, UA Negative Negative   Ketones,  UA Negative Negative   RBC, UA Negative Negative   Bilirubin, UA Negative Negative   Urobilinogen, Ur 0.2 0.2 - 1.0 mg/dL   Nitrite, UA Negative Negative   Microscopic Examination See below:   Bladder Scan (Post Void Residual) in office  Result Value Ref Range   Scan Result 90ml     Assessment & Plan:    1. Mixed stress and urge urinary incontinence History of severe stress incontinence status post sling in 2006  More recently, she has been having increased urinary frequency urgency nocturia as well as leakage with standing.  Based on her history today, is difficult to elucidate whether or not this is stress incontinence, stress induced urge or urge incontinence in this exact clinical scenario.  Overall leakage is relatively small but very bothersome.   We discussed the pathophysiology and different strain stress and urge incontinence.  I like to try to treat her urge symptoms and see if she has improvement in her overall symptoms.  Down the road, she may benefit  from urodynamics.  She reports today that she is a "snowbird" and will be going to [place] for the next 6 months in the near future.  Plan to get her started on oxybutynin 10 mg XL, discussed possible side effects including dry eyes dry mouth and constipation.  She is actually happy about its constipating effects based on her frequent loose stools.  While she is away, she can feel free to message me via MyChart and adjust medications as needed.  When she returns, she like to follow-up with Dr. Matilde Sprang who specializes in female urinary incontinence.  - Urinalysis, Complete - Bladder Scan (Post Void Residual) in office  2. Nocturia As above Behavior modification discussed  3. Foul smelling urine Urinalysis negative, no evidence of UTI Urged hydration    Return in about 6 months (around 04/28/2020) for MacDiarmid.  Hollice Espy, MD  [facility] [address] [place], [place] [address] [phone]

## 2019-10-31 LAB — MICROSCOPIC EXAMINATION: RBC, Urine: NONE SEEN /hpf (ref 0–2)

## 2019-10-31 LAB — URINALYSIS, COMPLETE
Bilirubin, UA: NEGATIVE
Glucose, UA: NEGATIVE
Ketones, UA: NEGATIVE
Nitrite, UA: NEGATIVE
Protein,UA: NEGATIVE
RBC, UA: NEGATIVE
Specific Gravity, UA: 1.025 (ref 1.005–1.030)
Urobilinogen, Ur: 0.2 mg/dL (ref 0.2–1.0)
pH, UA: 5 (ref 5.0–7.5)

## 2020-04-30 ENCOUNTER — Other Ambulatory Visit: Payer: Self-pay | Admitting: Urology

## 2020-04-30 DIAGNOSIS — N3946 Mixed incontinence: Secondary | ICD-10-CM

## 2020-05-05 ENCOUNTER — Other Ambulatory Visit: Payer: Self-pay | Admitting: Internal Medicine

## 2020-05-05 DIAGNOSIS — Z1231 Encounter for screening mammogram for malignant neoplasm of breast: Secondary | ICD-10-CM

## 2020-06-01 ENCOUNTER — Ambulatory Visit: Payer: No Typology Code available for payment source | Admitting: Urology

## 2020-06-08 ENCOUNTER — Ambulatory Visit
Admission: RE | Admit: 2020-06-08 | Discharge: 2020-06-08 | Disposition: A | Discharge status: Home / Self Care | Payer: No Typology Code available for payment source | Source: Ambulatory Visit | Attending: Internal Medicine | Admitting: Internal Medicine

## 2020-06-08 DIAGNOSIS — Z1231 Encounter for screening mammogram for malignant neoplasm of breast: Secondary | ICD-10-CM | POA: Insufficient documentation

## 2020-09-14 IMAGING — DX DG HIP (WITH OR WITHOUT PELVIS) 2-3V LEFT
2 series · 2 of 2 positions shown · non-contrast
Comparison: None.

CLINICAL DATA: Initial evaluation for postoperative pain, recent
left hip replacement.

EXAM:
DG HIP (WITH OR WITHOUT PELVIS) 2-3V LEFT

[hip ap]
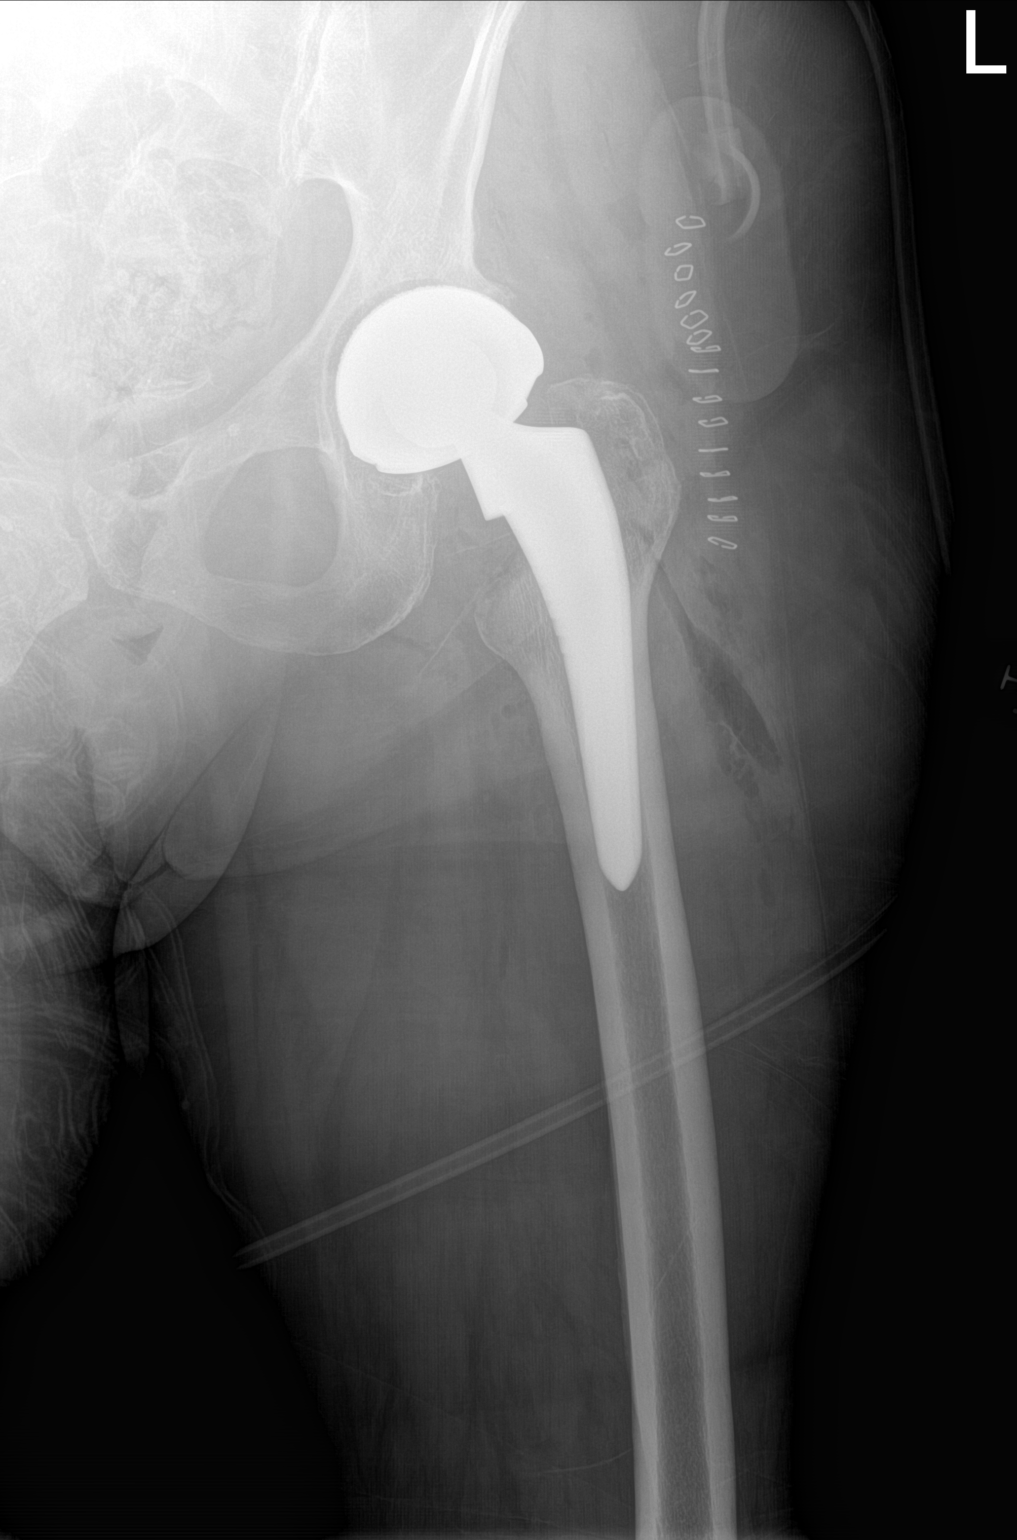

[hip lat]
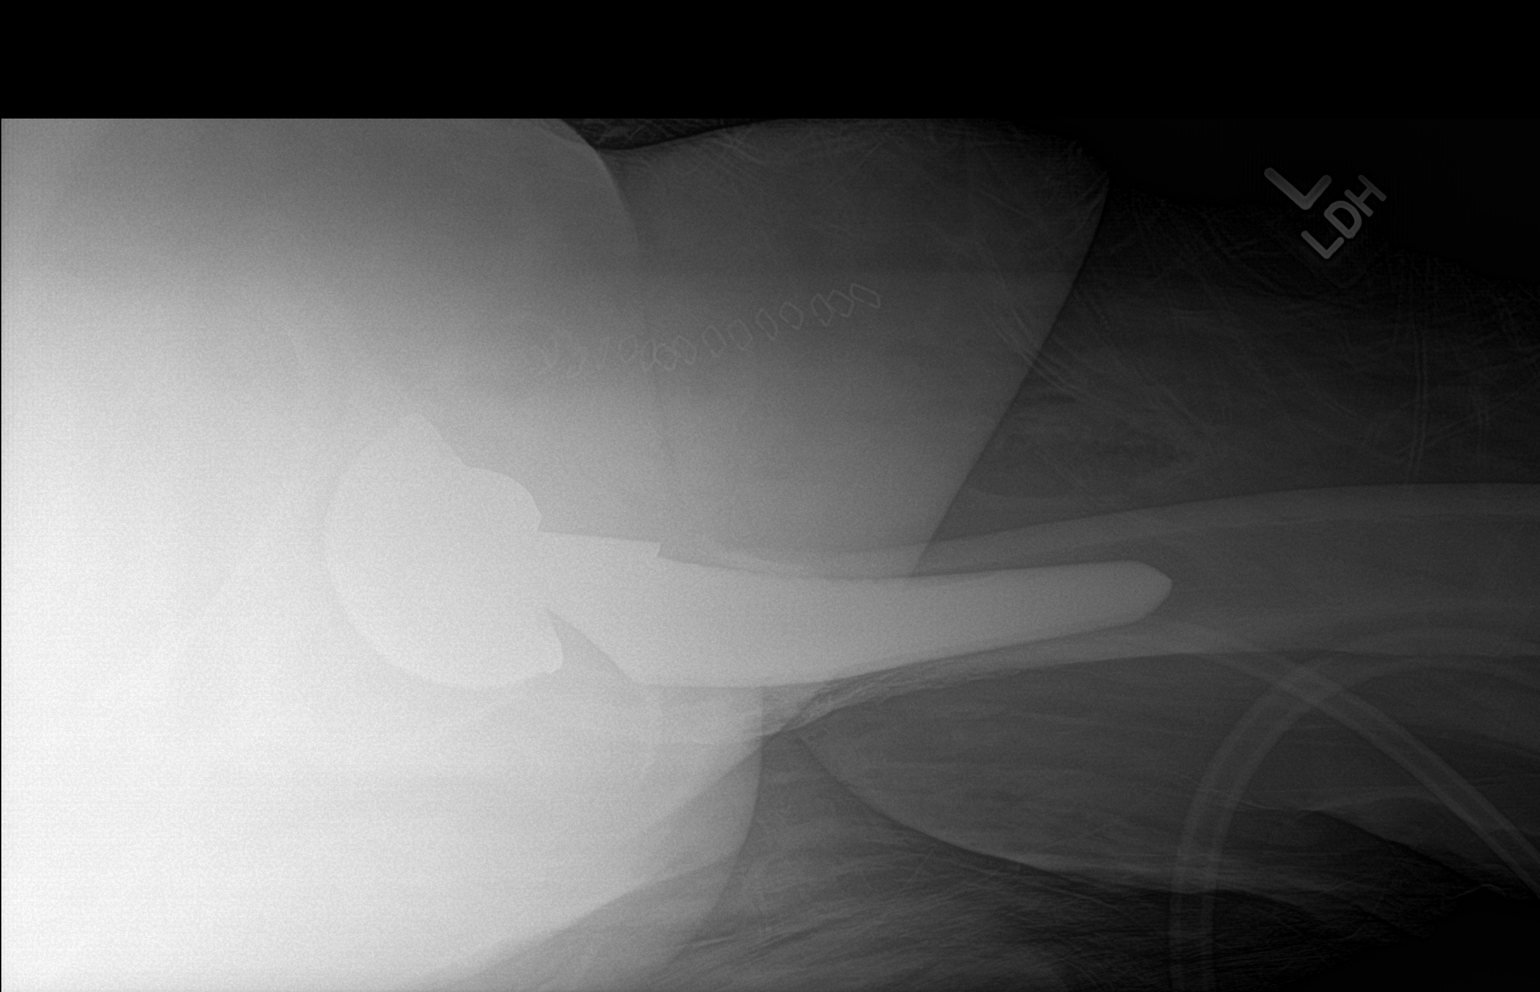

[2 of 2 positions shown; findings below may reference images not displayed]

FINDINGS: Postoperative changes from recent left total hip arthroplasty.
Acetabular and femoral components appear well positioned. No
periprosthetic fracture or other complication. Postoperative
swelling and emphysema seen about the left hip. Skin staples in
place.

Visualized pelvis intact.
IMPRESSION: Postoperative changes from recent left total hip arthroplasty
without complication.

## 2020-09-14 IMAGING — XA OPERATIVE LEFT HIP WITH PELVIS
2 series · 9 of 9 positions shown · non-contrast
Comparison: None.

CLINICAL DATA: Left hip replacement.

FLUOROSCOPY TIME:  18 seconds.
Images: 3
EXAM:
OPERATIVE LEFT HIP (WITH PELVIS IF PERFORMED) 3 VIEWS
TECHNIQUE: Fluoroscopic spot image(s) were submitted for interpretation
post-operatively.

[Series 1: ortho standard · 4 of 9 frames shown (1 of 2)]
[frame 2/9]
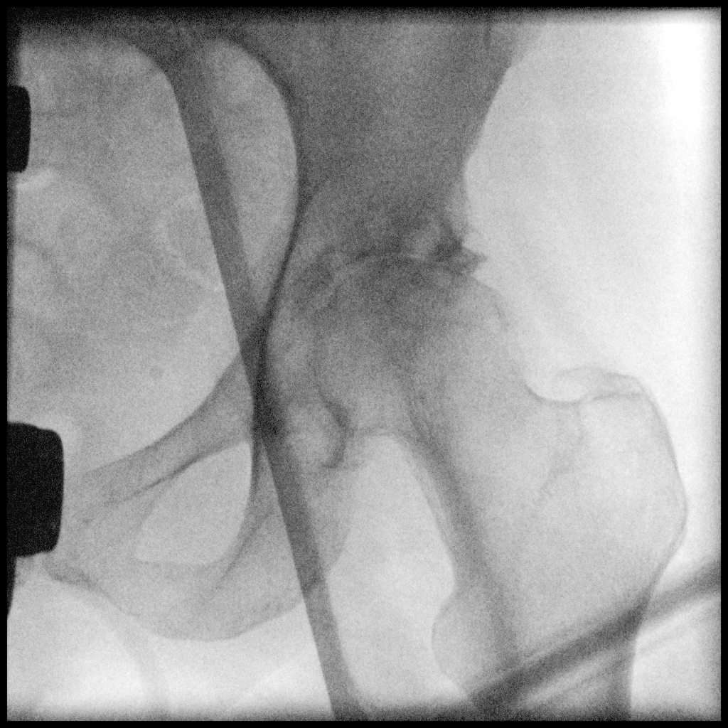
[frame 5/9]
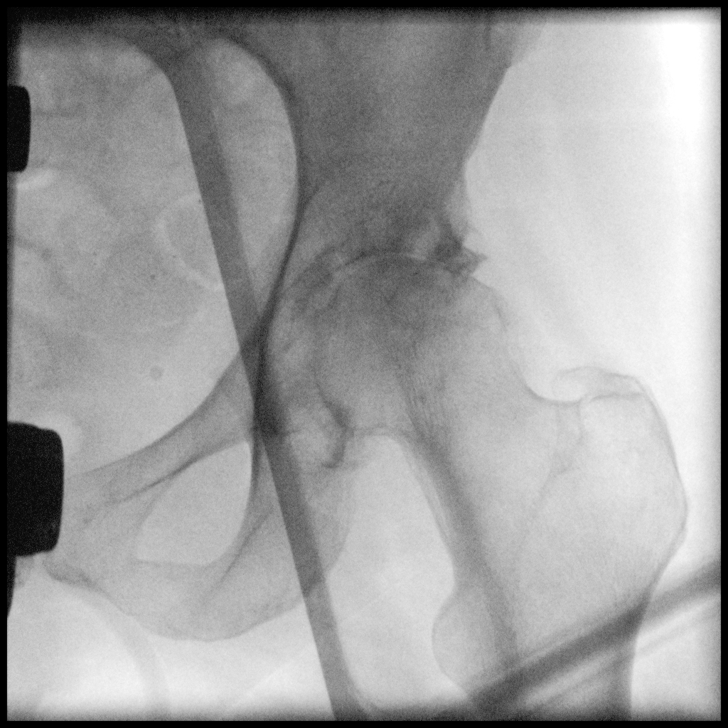
[frame 8/9]
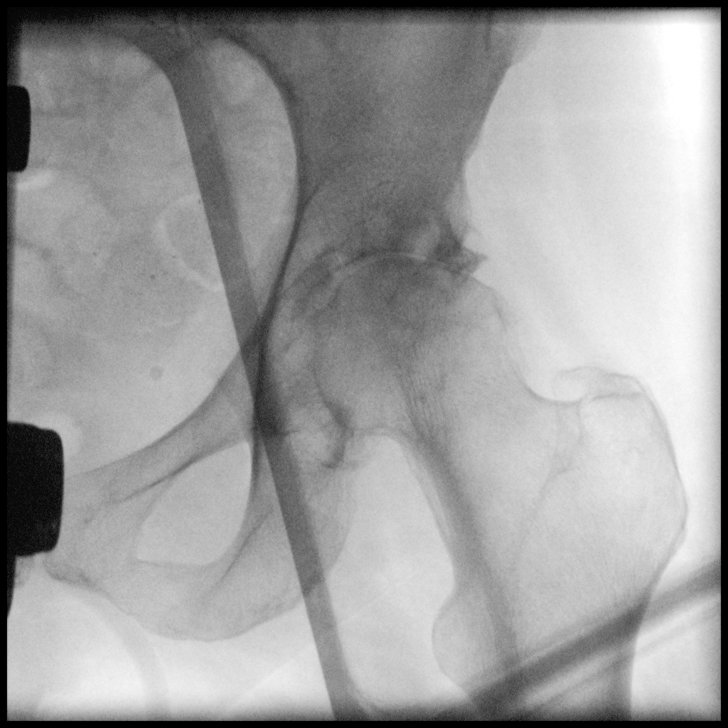
[frame 9/9]
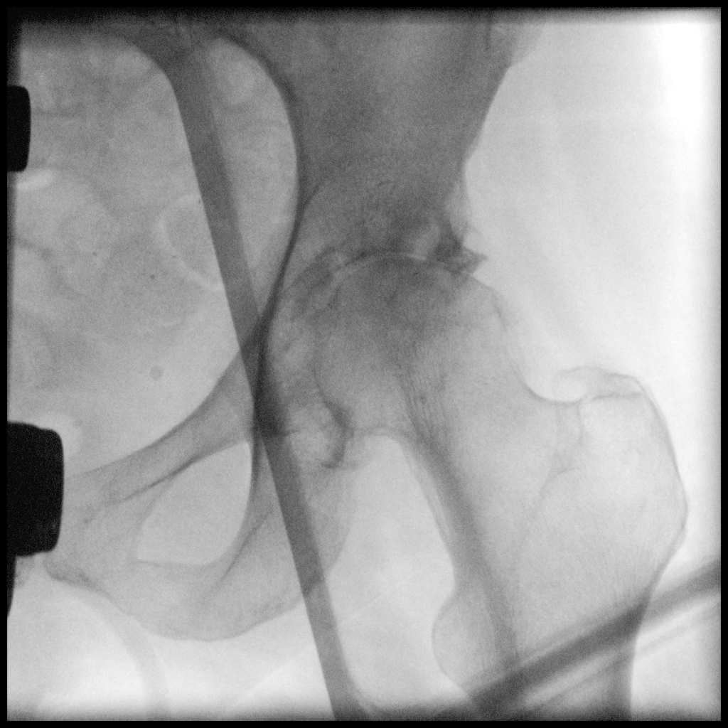

[Series 11: ortho standard · 2 acquisitions, 5 frames shown (2 of 2)]
[im 1/2]
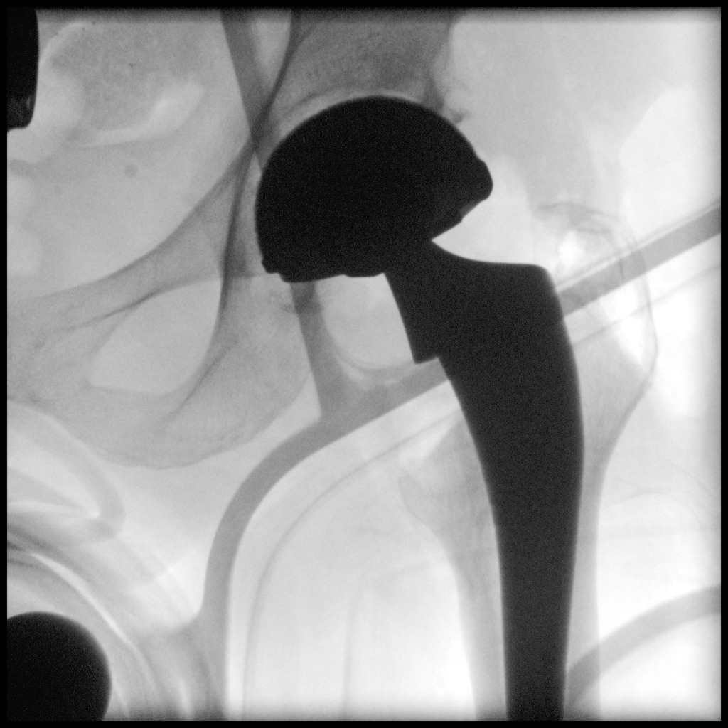
[im 2/2]
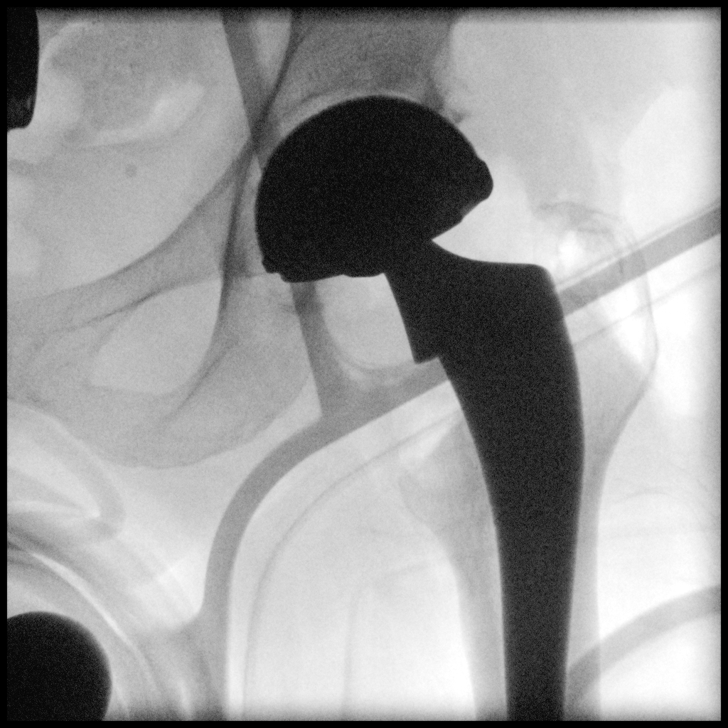
[im 2/2]
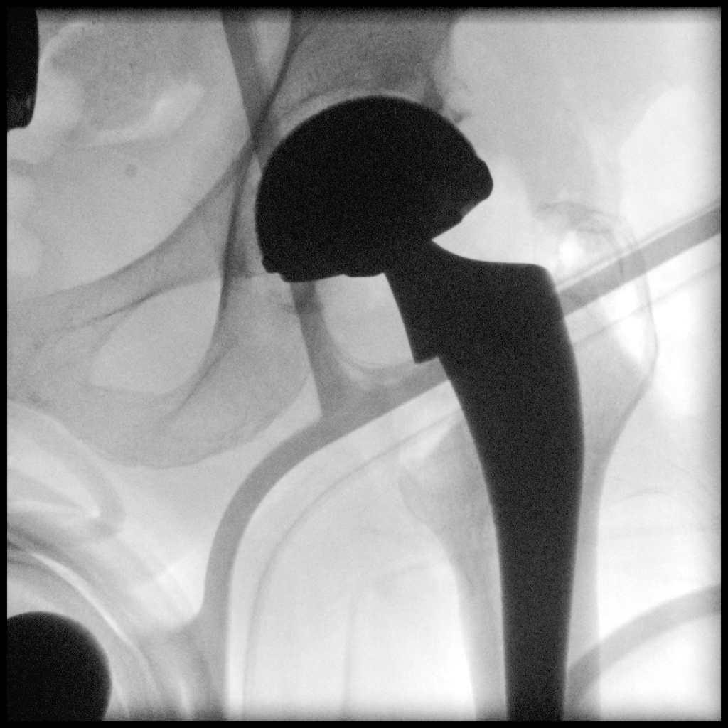
[im 2/2]
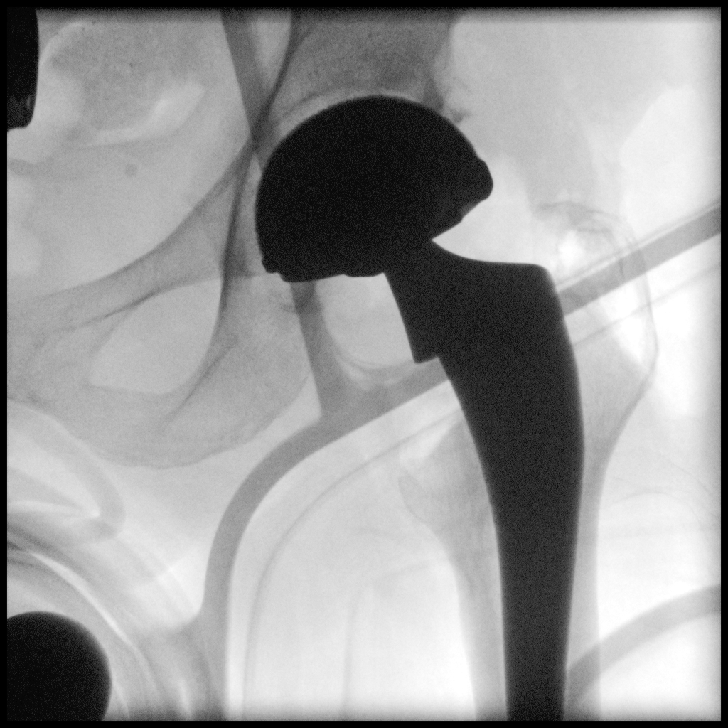
[im 2/2]
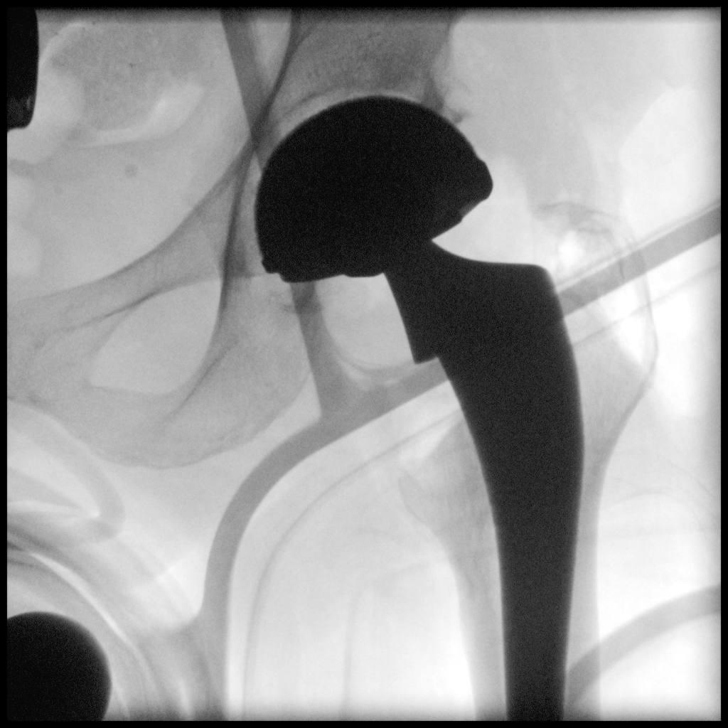

[9 of 9 positions shown; findings below may reference images not displayed]

FINDINGS: Patient is status post left hip replacement. Within visualize
limits, hardware is in good position. The distal femoral component
is not included on today's study.
IMPRESSION: Left hip replacement as above.
